# Patient Record
Sex: Female | Born: 1975
Health system: Southern US, Community
[De-identification: ages and names within clinical notes are randomized; demographics above are authoritative.]

## PROBLEM LIST (undated history)

## (undated) DIAGNOSIS — F845 Asperger's syndrome: Secondary | ICD-10-CM

## (undated) DIAGNOSIS — R112 Nausea with vomiting, unspecified: Secondary | ICD-10-CM

## (undated) DIAGNOSIS — F419 Anxiety disorder, unspecified: Secondary | ICD-10-CM

## (undated) DIAGNOSIS — Z803 Family history of malignant neoplasm of breast: Secondary | ICD-10-CM

## (undated) DIAGNOSIS — E785 Hyperlipidemia, unspecified: Secondary | ICD-10-CM

## (undated) DIAGNOSIS — Z808 Family history of malignant neoplasm of other organs or systems: Secondary | ICD-10-CM

## (undated) DIAGNOSIS — I10 Essential (primary) hypertension: Secondary | ICD-10-CM

## (undated) DIAGNOSIS — F32A Depression, unspecified: Secondary | ICD-10-CM

## (undated) DIAGNOSIS — Z9889 Other specified postprocedural states: Secondary | ICD-10-CM

## (undated) DIAGNOSIS — Z8042 Family history of malignant neoplasm of prostate: Secondary | ICD-10-CM

## (undated) DIAGNOSIS — K589 Irritable bowel syndrome without diarrhea: Secondary | ICD-10-CM

## (undated) DIAGNOSIS — F329 Major depressive disorder, single episode, unspecified: Secondary | ICD-10-CM

## (undated) DIAGNOSIS — G43909 Migraine, unspecified, not intractable, without status migrainosus: Secondary | ICD-10-CM

## (undated) HISTORY — PX: LAPAROTOMY: SHX154

## (undated) HISTORY — DX: Anxiety disorder, unspecified: F41.9

## (undated) HISTORY — DX: Family history of malignant neoplasm of breast: Z80.3

## (undated) HISTORY — DX: Irritable bowel syndrome, unspecified: K58.9

## (undated) HISTORY — DX: Hyperlipidemia, unspecified: E78.5

## (undated) HISTORY — DX: Family history of malignant neoplasm of other organs or systems: Z80.8

## (undated) HISTORY — PX: LAPAROSCOPY: SHX197

## (undated) HISTORY — PX: HERNIA REPAIR: SHX51

## (undated) HISTORY — DX: Asperger's syndrome: F84.5

## (undated) HISTORY — PX: ABDOMINAL HYSTERECTOMY: SHX81

## (undated) HISTORY — DX: Essential (primary) hypertension: I10

## (undated) HISTORY — DX: Migraine, unspecified, not intractable, without status migrainosus: G43.909

## (undated) HISTORY — DX: Family history of malignant neoplasm of prostate: Z80.42

---

## 2000-04-19 ENCOUNTER — Other Ambulatory Visit: Admission: RE | Admit: 2000-04-19 | Discharge: 2000-04-19 | Payer: Self-pay | Admitting: *Deleted

## 2004-02-27 ENCOUNTER — Emergency Department (HOSPITAL_COMMUNITY): Admission: EM | Admit: 2004-02-27 | Discharge: 2004-02-27 | Payer: Self-pay | Admitting: Emergency Medicine

## 2007-02-09 ENCOUNTER — Encounter: Admission: RE | Admit: 2007-02-09 | Discharge: 2007-02-09 | Payer: Self-pay | Admitting: *Deleted

## 2007-03-24 ENCOUNTER — Other Ambulatory Visit: Admission: RE | Admit: 2007-03-24 | Discharge: 2007-03-24 | Payer: Self-pay | Admitting: Internal Medicine

## 2008-01-30 ENCOUNTER — Emergency Department (HOSPITAL_COMMUNITY): Admission: EM | Admit: 2008-01-30 | Discharge: 2008-01-30 | Payer: Self-pay | Admitting: Emergency Medicine

## 2008-02-23 ENCOUNTER — Encounter: Admission: RE | Admit: 2008-02-23 | Discharge: 2008-02-23 | Payer: Self-pay | Admitting: Family Medicine

## 2009-07-30 ENCOUNTER — Encounter: Admission: RE | Admit: 2009-07-30 | Discharge: 2009-07-30 | Payer: Self-pay | Admitting: Family Medicine

## 2009-09-05 ENCOUNTER — Ambulatory Visit (HOSPITAL_COMMUNITY): Admission: RE | Admit: 2009-09-05 | Discharge: 2009-09-05 | Payer: Self-pay | Admitting: Obstetrics and Gynecology

## 2009-09-10 ENCOUNTER — Encounter: Admission: RE | Admit: 2009-09-10 | Discharge: 2009-09-10 | Payer: Self-pay | Admitting: Obstetrics and Gynecology

## 2009-12-11 ENCOUNTER — Encounter: Admission: RE | Admit: 2009-12-11 | Discharge: 2009-12-11 | Payer: Self-pay | Admitting: Obstetrics and Gynecology

## 2009-12-24 ENCOUNTER — Ambulatory Visit (HOSPITAL_COMMUNITY): Admission: RE | Admit: 2009-12-24 | Discharge: 2009-12-24 | Payer: Self-pay | Admitting: Obstetrics and Gynecology

## 2010-08-21 ENCOUNTER — Encounter: Payer: Self-pay | Admitting: Pulmonary Disease

## 2010-10-09 DIAGNOSIS — F329 Major depressive disorder, single episode, unspecified: Secondary | ICD-10-CM

## 2010-10-09 DIAGNOSIS — E785 Hyperlipidemia, unspecified: Secondary | ICD-10-CM

## 2010-10-10 ENCOUNTER — Ambulatory Visit: Payer: Self-pay | Admitting: Pulmonary Disease

## 2010-10-10 ENCOUNTER — Encounter: Payer: Self-pay | Admitting: Pulmonary Disease

## 2010-10-10 DIAGNOSIS — R0609 Other forms of dyspnea: Secondary | ICD-10-CM | POA: Insufficient documentation

## 2010-10-10 DIAGNOSIS — R0989 Other specified symptoms and signs involving the circulatory and respiratory systems: Secondary | ICD-10-CM

## 2010-10-10 DIAGNOSIS — I10 Essential (primary) hypertension: Secondary | ICD-10-CM

## 2010-10-10 DIAGNOSIS — J329 Chronic sinusitis, unspecified: Secondary | ICD-10-CM | POA: Insufficient documentation

## 2010-10-21 ENCOUNTER — Ambulatory Visit (HOSPITAL_COMMUNITY)
Admission: RE | Admit: 2010-10-21 | Discharge: 2010-10-21 | Payer: Self-pay | Source: Home / Self Care | Admitting: Pulmonary Disease

## 2010-10-22 ENCOUNTER — Encounter: Payer: Self-pay | Admitting: Pulmonary Disease

## 2010-10-22 DIAGNOSIS — R05 Cough: Secondary | ICD-10-CM

## 2010-10-23 ENCOUNTER — Encounter: Payer: Self-pay | Admitting: Pulmonary Disease

## 2010-11-06 ENCOUNTER — Ambulatory Visit: Payer: Self-pay | Admitting: Internal Medicine

## 2010-12-15 ENCOUNTER — Other Ambulatory Visit: Payer: Self-pay | Admitting: Family Medicine

## 2010-12-15 ENCOUNTER — Ambulatory Visit
Admission: RE | Admit: 2010-12-15 | Discharge: 2010-12-15 | Payer: Self-pay | Source: Home / Self Care | Attending: Pulmonary Disease | Admitting: Pulmonary Disease

## 2010-12-15 DIAGNOSIS — Z1239 Encounter for other screening for malignant neoplasm of breast: Secondary | ICD-10-CM

## 2010-12-15 DIAGNOSIS — J310 Chronic rhinitis: Secondary | ICD-10-CM | POA: Insufficient documentation

## 2010-12-15 DIAGNOSIS — Z1231 Encounter for screening mammogram for malignant neoplasm of breast: Secondary | ICD-10-CM

## 2010-12-23 NOTE — Miscellaneous (Signed)
Summary: med list updated  Clinical Lists Changes  Medications: Added new medication of TOPAMAX 25 MG TABS (TOPIRAMATE) once daily

## 2010-12-23 NOTE — Miscellaneous (Signed)
Summary: Orders Update   Clinical Lists Changes  Orders: Added new Referral order of Radiology Referral (Radiology) - Signed 

## 2010-12-23 NOTE — Assessment & Plan Note (Signed)
Summary: consult for recurrent respiratory infections.   Visit Type:  Initial Consult Copy to:  Herb Grays MD Primary Provider/Referring Provider:  Dewain Penning MD  CC:  Pulmonary Consult. pt c/o cough, recurring lung infections, and trouble breahting.  History of Present Illness: the pt is a 34y/o female who I have been asked to see for dyspnea and recurrent respiratory infections.  She has been having recurrent sinus and chest infections this year, but has not been diagnosed with any chronic lung process as of yet.  Her first issue occurred in Mikka of the year when she had ?pna (xrays not available), and was treated with augmentin for 10 days.  She then had symptoms c/w a sinus infection in May, and was treated with depomedrol and levaquin.  In Sept of this year she became sick again, and cxr report mentions a left perihilar opacity, for which she was treated with levaquin again.  In Nov she had worsening sinus symptoms, and was treated with rocephin, ceftin, pred taper.  The pt tells me each time she gets sick, it starts in her upper airway with sinus pressure and congestion, along with purulence from her nares.  It then goes to her chest and results in dry cough and sob that interferes with her QOL.  She often does not bring up purulent mucus from chest.  She has seen ENT in the past with negative w/u per her history.  She has seen an allergist with +w/u, but couldn't afford allergy vaccine.  Currently, the pt is doing better, with no significant cough or sinus symptoms.  She has no prior h/o asthma, and denies reflux being an issue.    Preventive Screening-Counseling & Management  Alcohol-Tobacco     Smoking Status: quit  Current Medications (verified): 1)  Prednisone 50 Mg Tabs (Prednisone) .... Once Daily X 5 Days 2)  Keflex (? Strenghth) .Marland Kitchen.. 1 Tablet Two Times A Day X 10 Days 3)  Rhinocort Aqua 32 Mcg/act Susp (Budesonide) .... 2 Sprays Per Nostrile Evry Am 4)  Jolessa 0.15-0.03 Mg  Tabs (Levonorgest-Eth Estrad 91-Day) .... Once Daily 5)  Celexa 20 Mg Tabs (Citalopram Hydrobromide) .... Once Daily 6)  Claritin 10 Mg Tabs (Loratadine) .... Once Daily 7)  Niacin (? Stregnth) .... Once Daily 8)  Red Yeast Rice 600 Mg Caps (Red Yeast Rice Extract) .... 2 Once Daily 9)  Tramadol (? Strength) .... As Needed 10)  Proventil Hfa 108 (90 Base) Mcg/act Aers (Albuterol Sulfate) .... 2 Puffs Every 4 Hrs As Needed  Allergies (verified): 1)  ! * Latex  Past History:  Past Medical History: Depression Hyperlipidemia Hypertension  Past Surgical History: hernia repair endometrial surgery x 2  Family History: Reviewed history from 10/09/2010 and no changes required. Family History Asthma: grandpa, brother Family History Breast Cancer:mother, grandmother Family History Hyperlipidemia Family History Prostate Cancer: father ephysema--grandpa  Social History: Reviewed history and no changes required. Patient states former smoker. 2003. occasional smoker occupation: pet sitting owner married Smoking Status:  quit  Review of Systems       The patient complains of shortness of breath with activity, productive cough, non-productive cough, chest pain, loss of appetite, tooth/dental problems, headaches, nasal congestion/difficulty breathing through nose, ear ache, and hand/feet swelling.  The patient denies shortness of breath at rest, coughing up blood, irregular heartbeats, acid heartburn, indigestion, weight change, abdominal pain, difficulty swallowing, sore throat, sneezing, itching, anxiety, depression, rash, change in color of mucus, and fever.    Vital Signs:  Patient profile:   35 year old female Height:      62 inches Weight:      200.13 pounds BMI:     36.74 O2 Sat:      98 % on Room air Temp:     98.1 degrees F oral Pulse rate:   78 / minute BP sitting:   112 / 58  (left arm) Cuff size:   large  Vitals Entered By: Carver Fila (October 10, 2010 2:56 PM)  O2  Flow:  Room air CC: Pulmonary Consult. pt c/o cough, recurring lung infections, trouble breahting Comments meds and allergies updated Phone number updated Carver Fila  October 10, 2010 3:09 PM    Physical Exam  General:  obese female in nad Eyes:  PERRLA and EOMI.   Nose:  patent without discharge.  Deviated septum to right with narrowing. Mouth:  clear, no exudates or lesions. Neck:  no jvd, tmg, LN Lungs:  totally clear to auscultation Heart:  rrr, no mrg Abdomen:  soft and nontender, bs+ Extremities:  no edema or cyanosis  pulses intact distally Neurologic:  alert and oriented, moves all 4.   Impression & Recommendations:  Problem # 1:  DYSPNEA ON EXERTION (ICD-786.09) the pt has recurrent respiratory tract infections?/inflammation with dyspnea of unknown etiology.  Her history is very suspicious for chronic sinusutis, and she needs to have a ct of her sinuses for evaluation.  Other possibilities include bronchiectasis and asthma, although her simple spirometry today is normal.  It will be important for me to review her xrays, and may need HRCT chest to evaluate for bronchiectasis.  I would also consider other etiologies such as allergic disease, hypogammaglobulinemia, and asthma.  At this point, would like to start with ct sinuses since this seems to be the common denominator in all of her episodes, and she will have her xrays put on a disk for my review.  Will arrange followup thereafter.    Medications Added to Medication List This Visit: 1)  Prednisone 50 Mg Tabs (Prednisone) .... Once daily x 5 days 2)  Keflex (? Strenghth)  .Marland Kitchen.. 1 tablet two times a day x 10 days 3)  Rhinocort Aqua 32 Mcg/act Susp (Budesonide) .... 2 sprays per nostrile evry am 4)  Jolessa 0.15-0.03 Mg Tabs (Levonorgest-eth estrad 91-day) .... Once daily 5)  Celexa 20 Mg Tabs (Citalopram hydrobromide) .... Once daily 6)  Claritin 10 Mg Tabs (Loratadine) .... Once daily 7)  Niacin (? Stregnth)  .... Once  daily 8)  Red Yeast Rice 600 Mg Caps (Red yeast rice extract) .... 2 once daily 9)  Tramadol (? Strength)  .... As needed 10)  Proventil Hfa 108 (90 Base) Mcg/act Aers (Albuterol sulfate) .... 2 puffs every 4 hrs as needed  Other Orders: Consultation Level IV (16109) Spirometry w/Graph (60454) Radiology Referral (Radiology)  Patient Instructions: 1)  please pick up disc of cxr's, and drop off out front for me to review. 2)  will check scan of sinuses, and I will call you with results.

## 2010-12-25 NOTE — Assessment & Plan Note (Signed)
Summary: acute sick visit for rhinitis    Copy to:  Herb Grays MD Primary Provider/Referring Provider:  Dewain Penning MD  CC:  Sick visit.  symptoms started 3 days.  pt c/o nasal drainage with clear to yellow to green color and streaks of bright red blood in nasal drainage. non-productive cough and body aches and increased sob. Marland Kitchen  History of Present Illness: the pt comes in today for an acute sick visit.  She has a h/o recurrent pulmonary "infections", and at the last visit this was felt possibly due to chronic sinusitis.  She underwent a ct sinuses, and showed no acute or chronic sinusitis.  She had a ct chest with no evidence of bronchiectasis, and had spirometry which showed no airflow obstruction even with active symptoms.  She does have a history of a positive allergy evaluation, and apparently vaccine was recommended but didn't have time to consider.  She works as a Nature conservation officer time.  She comes in today with a 3 day h/o nasal drainage that is clear to yellow with some blood staining.  She denies sinus pressure, fever, and no significant sob.  She has a nonproductive cough with no chest congestion.  She has not been around sources for viral illnesses.  Current Medications (verified): 1)  Rhinocort Aqua 32 Mcg/act Susp (Budesonide) .... 2 Sprays Per Nostrile Evry Am As Needed 2)  Jolessa 0.15-0.03 Mg Tabs (Levonorgest-Eth Estrad 91-Day) .... Once Daily 3)  Celexa 20 Mg Tabs (Citalopram Hydrobromide) .... Once Daily 4)  Claritin 10 Mg Tabs (Loratadine) .... Once Daily 5)  Niacin (? Stregnth) .... Once Daily 6)  Red Yeast Rice 600 Mg Caps (Red Yeast Rice Extract) .... 2 Once Daily 7)  Tramadol (? Strength) .... As Needed 8)  Proventil Hfa 108 (90 Base) Mcg/act Aers (Albuterol Sulfate) .... 2 Puffs Every 4 Hrs As Needed 9)  Topamax 25 Mg Tabs (Topiramate) .... Once Daily  Allergies (verified): 1)  ! * Latex  Past History:  Past medical, surgical, family and social histories  (including risk factors) reviewed, and no changes noted (except as noted below).  Past Medical History: Reviewed history from 10/10/2010 and no changes required. Depression Hyperlipidemia Hypertension  Past Surgical History: Reviewed history from 10/10/2010 and no changes required. hernia repair endometrial surgery x 2  Family History: Reviewed history from 10/10/2010 and no changes required. Family History Asthma: grandpa, brother Family History Breast Cancer:mother, grandmother Family History Hyperlipidemia Family History Prostate Cancer: father ephysema--grandpa  Social History: Reviewed history from 10/10/2010 and no changes required. Patient states former smoker. 2003. occasional smoker occupation: pet sitting owner married  Review of Systems       The patient complains of shortness of breath with activity, non-productive cough, loss of appetite, sore throat, headaches, nasal congestion/difficulty breathing through nose, sneezing, and itching.  The patient denies shortness of breath at rest, productive cough, coughing up blood, chest pain, irregular heartbeats, acid heartburn, indigestion, weight change, abdominal pain, difficulty swallowing, tooth/dental problems, ear ache, anxiety, depression, hand/feet swelling, joint stiffness or pain, rash, change in color of mucus, and fever.    Vital Signs:  Patient profile:   35 year old female Height:      62 inches Weight:      206.25 pounds BMI:     37.86 O2 Sat:      97 % on Room air Temp:     98.0 degrees F oral Pulse rate:   72 / minute BP sitting:   118 /  82  (left arm) Cuff size:   large  Vitals Entered By: Arman Filter LPN (December 15, 2010 4:16 PM)  O2 Flow:  Room air CC: Sick visit.  symptoms started 3 days.  pt c/o nasal drainage with clear to yellow to green color and streaks of bright red blood in nasal drainage. non-productive cough and body aches and increased sob.  Comments Medications reviewed with  patient Arman Filter LPN  December 15, 2010 4:17 PM    Physical Exam  General:  obese female in nad Nose:  mild erythema bilat with heme staining, no purulent secretions noted.   Mouth:  no exudates, +postnasal drip Lungs:  totally clear to auscultation no wheezing Heart:  rrr, no mrg Extremities:  no edema or cyanosis Neurologic:  alert, oriented, moves all 4.   Impression & Recommendations:  Problem # 1:  CHRONIC RHINITIS (ICD-472.0)  the pt is having symptoms again of nasal drainage that is clear to yellow, sore throat, and cough.  She has a normal ct sinuses, normal ct chest, and normal spirometry.  I suspect her recurrent symptoms are more due to chronic rhinitis than actual infection, and she has known significant allergic disease from prior testing in the past.  I would like to treat her conservatively with daily nasal ICS, changing her antihistamine to zyrtec, and to use neilmed sinus rinses regularly.  If she continues to be symptomatic, I would recommend a f/u allergy evaluation.  Medications Added to Medication List This Visit: 1)  Rhinocort Aqua 32 Mcg/act Susp (Budesonide) .... 2 sprays per nostrile evry am as needed  Other Orders: Est. Patient Level IV (84132)  Patient Instructions: 1)  continue with rhinocort 2 each nostril each am every day 2)  try zyrtec at bedtime in the place of claritin as a trial 3)  neilmed sinus rinses am and pm until better, then each am thereafter if doing well. 4)  please call if having persistent symptoms, and we can discuss allergy referral.

## 2010-12-31 ENCOUNTER — Ambulatory Visit: Payer: Self-pay

## 2011-01-06 ENCOUNTER — Encounter: Payer: Self-pay | Admitting: Family Medicine

## 2011-01-19 ENCOUNTER — Ambulatory Visit
Admission: RE | Admit: 2011-01-19 | Discharge: 2011-01-19 | Disposition: A | Discharge status: Home / Self Care | Payer: 59 | Source: Ambulatory Visit | Attending: Family Medicine | Admitting: Family Medicine

## 2011-01-19 DIAGNOSIS — Z1231 Encounter for screening mammogram for malignant neoplasm of breast: Secondary | ICD-10-CM

## 2011-02-08 LAB — CBC
MCHC: 33.7 g/dL (ref 30.0–36.0)
MCV: 88.2 fL (ref 78.0–100.0)
Platelets: 367 10*3/uL (ref 150–400)
WBC: 6.8 10*3/uL (ref 4.0–10.5)

## 2011-02-08 LAB — HCG, SERUM, QUALITATIVE: Preg, Serum: NEGATIVE

## 2011-08-17 LAB — D-DIMER, QUANTITATIVE: D-Dimer, Quant: 1.14 — ABNORMAL HIGH

## 2012-01-14 ENCOUNTER — Emergency Department (HOSPITAL_COMMUNITY)
Admission: EM | Admit: 2012-01-14 | Discharge: 2012-01-14 | Disposition: A | Discharge status: Home / Self Care | Payer: 59 | Attending: Emergency Medicine | Admitting: Emergency Medicine

## 2012-01-14 ENCOUNTER — Encounter (HOSPITAL_COMMUNITY): Payer: Self-pay | Admitting: Emergency Medicine

## 2012-01-14 DIAGNOSIS — Z9109 Other allergy status, other than to drugs and biological substances: Secondary | ICD-10-CM | POA: Insufficient documentation

## 2012-01-14 DIAGNOSIS — L299 Pruritus, unspecified: Secondary | ICD-10-CM | POA: Insufficient documentation

## 2012-01-14 DIAGNOSIS — T7840XA Allergy, unspecified, initial encounter: Secondary | ICD-10-CM

## 2012-01-14 DIAGNOSIS — Z79899 Other long term (current) drug therapy: Secondary | ICD-10-CM | POA: Insufficient documentation

## 2012-01-14 MED ORDER — DIPHENHYDRAMINE HCL 50 MG/ML IJ SOLN
50.0000 mg | Freq: Once | INTRAMUSCULAR | Status: AC
  Administered 2012-01-14: 50 mg via INTRAVENOUS
  Filled 2012-01-14: qty 1

## 2012-01-14 MED ORDER — METHYLPREDNISOLONE SODIUM SUCC 125 MG IJ SOLR
125.0000 mg | Freq: Once | INTRAMUSCULAR | Status: AC
  Administered 2012-01-14: 125 mg via INTRAVENOUS
  Filled 2012-01-14: qty 2

## 2012-01-14 MED ORDER — FAMOTIDINE 20 MG PO TABS: 20.0000 mg | ORAL_TABLET | Freq: Two times a day (BID) | ORAL | Status: DC

## 2012-01-14 MED ORDER — EPINEPHRINE 0.3 MG/0.3ML IJ DEVI: 0.3000 mg | INTRAMUSCULAR | Status: DC | PRN

## 2012-01-14 MED ORDER — PREDNISONE 10 MG PO TABS: ORAL_TABLET | ORAL | Status: DC

## 2012-01-14 MED ORDER — FAMOTIDINE IN NACL 20-0.9 MG/50ML-% IV SOLN
20.0000 mg | Freq: Once | INTRAVENOUS | Status: AC
  Administered 2012-01-14: 20 mg via INTRAVENOUS
  Filled 2012-01-14: qty 50

## 2012-01-14 NOTE — ED Provider Notes (Signed)
History     CSN: [id number]  Arrival date & time 01/14/12  1801   First MD Initiated Contact with Patient 01/14/12 1825      Chief Complaint  Patient presents with  . Allergic Reaction    (Consider location/radiation/quality/duration/timing/severity/associated sxs/prior treatment) HPI  Patient relates she has a lot of environmental allergies. She states today she was at [facility] to have allergy testing done. She states she stopped all her allergy medicine which was Allegra about 7 days ago. She states they put the allergens in her back and almost immediately she started itching and felt like her breathing was heavy. She states she felt like her thoughts were not clear and they commented that she appeared to be very flushed. She was given Benadryl 50 mg and EpiPen 0.3 mg and sent to the ER patient states now she just feels heavy all over. She denies chest pain, headache, nausea, vomiting, diarrhea and  PCP Dr Collins [place]  History reviewed. No pertinent past medical history. Environmental allergies  History reviewed. No pertinent past surgical history.  History reviewed. No pertinent family history.  History  Substance Use Topics  . Smoking status: No  . Smokeless tobacco: Not on file  . Alcohol Use: No  Employed  OB History    Grav Para Term Preterm Abortions TAB SAB Ect Mult Living                  Review of Systems  All other systems reviewed and are negative.    Allergies  Latex  Home Medications   Current Outpatient Rx  Name Route Sig Dispense Refill  . BUPROPION HCL ER (XL) 150 MG PO TB24 Oral Take 150 mg by mouth daily.    Marland Kitchen CITALOPRAM HYDROBROMIDE 20 MG PO TABS Oral Take 20 mg by mouth daily.    Joyce Copa PO Oral Take 1 tablet by mouth daily.    Marland Kitchen NIACIN CR PO Oral Take 1 tablet by mouth daily.    Marland Kitchen NITROFURANTOIN MONOHYD MACRO 100 MG PO CAPS Oral Take 100 mg by mouth 2 (two) times daily. Course started 01/12/12 for 5 day supply    . TOPIRAMATE 25 MG PO  TABS Oral Take 25 mg by mouth daily.    Marland Kitchen EPINEPHRINE 0.3 MG/0.3ML IJ DEVI Intramuscular Inject 0.3 mLs (0.3 mg total) into the muscle as needed. 1 Device 0  . FAMOTIDINE 20 MG PO TABS Oral Take 1 tablet (20 mg total) by mouth 2 (two) times daily. 30 tablet 0  . PREDNISONE 10 MG PO TABS  Take 3 po QD x 2d starting tomorrow, then 2 po QD x 3d then 1 po QD x 3d 15 tablet 0    BP 130/77  Pulse 87  Temp(Src) 98.7 F (37.1 C) (Oral)  Resp 26  SpO2 99%  Vital signs normal     Physical Exam  Constitutional: She appears well-developed and well-nourished. She is cooperative.  HENT:  Head: Normocephalic and atraumatic.  Eyes: Conjunctivae and EOM are normal. Pupils are equal, round, and reactive to light.  Neck: Normal range of motion and full passive range of motion without pain. Neck supple.  Cardiovascular: Normal rate, regular rhythm and normal heart sounds.   Pulmonary/Chest: Effort normal and breath sounds normal. Not tachypneic. No respiratory distress.  Neurological: She has normal strength and normal reflexes. No cranial nerve deficit.  Skin: Skin is warm, dry and intact.       Patient has mild diffuse redness of her  back. She has also some mild redness of her upper chest neck and her face. There are no discrete urticarial lesions seen. There are no localized areas of swelling on her back where she had her skin testing done.  Psychiatric: She has a normal mood and affect. Her speech is normal and behavior is normal. Thought content normal.    ED Course  Procedures (including critical care time)   Medications  diphenhydrAMINE (BENADRYL) injection 50 mg (50 mg Intravenous Given 01/14/12 1917)  methylPREDNISolone sodium succinate (SOLU-MEDROL) 125 MG injection 125 mg (125 mg Intravenous Given 01/14/12 1917)  famotidine (PEPCID) IVPB 20 mg (20 mg Intravenous Given 01/14/12 1918)   When rechecked patient still had some mild redness of her face however the redness on her back and upper  chest were gone. She states she felt good enough to go home.  1. Allergic reaction     New Prescriptions   EPINEPHRINE (EPIPEN) 0.3 MG/0.3 ML DEVI    Inject 0.3 mLs (0.3 mg total) into the muscle as needed.   FAMOTIDINE (PEPCID) 20 MG TABLET    Take 1 tablet (20 mg total) by mouth 2 (two) times daily.   PREDNISONE (DELTASONE) 10 MG TABLET    Take 3 po QD x 2d starting tomorrow, then 2 po QD x 3d then 1 po QD x 3d   Plan discharge Devoria Albe, MD, Armando Gang   MDM          Ward Givens, MD 01/14/12 2114

## 2012-01-14 NOTE — ED Notes (Signed)
MD at bedside. 

## 2012-01-14 NOTE — ED Notes (Signed)
Patient is resting comfortably. 

## 2012-01-14 NOTE — ED Notes (Signed)
Pt assisted to restroom and back to bed. Pt had no issues with ambulation.

## 2012-01-14 NOTE — Discharge Instructions (Signed)
Take the Pepcid and prednisone until gone. Take Benadryl 50 mg every 4-6 hours as needed for itching or rash. Avoid getting hot this will make your allergic reaction worse. Use the epipen if you have trouble breathing or swallowing. Return to emergency department if you feel worse.

## 2012-01-14 NOTE — ED Notes (Signed)
Patient denies pain and is resting comfortably.  

## 2012-01-14 NOTE — ED Notes (Signed)
-   PT C/0 ALLERGIC REACTION WHILE AT ALLERGY TESTING.       REC'D BENEDRYL 50MG  IM AND EPI 0.3MG  IM.    NOW DENIES LOC OR SOB.

## 2012-01-14 NOTE — ED Notes (Signed)
Vital signs stable. 

## 2012-01-14 NOTE — ED Notes (Signed)
ZOX:[id number]<AV> Expected date:01/14/12<BR> Expected time: 5:47 PM<BR> Means of arrival:Ambulance<BR> Comments:<BR> EMS 80 GC - allergic reaction

## 2012-01-14 NOTE — ED Notes (Signed)
Pt.was gowned ,put on monitor ,vital were taken

## 2012-01-14 NOTE — ED Notes (Signed)
Family at bedside. 

## 2012-01-15 ENCOUNTER — Emergency Department (HOSPITAL_COMMUNITY)
Admission: EM | Admit: 2012-01-15 | Discharge: 2012-01-15 | Disposition: A | Discharge status: Home / Self Care | Payer: 59 | Attending: Emergency Medicine | Admitting: Emergency Medicine

## 2012-01-15 ENCOUNTER — Encounter (HOSPITAL_COMMUNITY): Payer: Self-pay | Admitting: *Deleted

## 2012-01-15 DIAGNOSIS — F329 Major depressive disorder, single episode, unspecified: Secondary | ICD-10-CM | POA: Insufficient documentation

## 2012-01-15 DIAGNOSIS — Z79899 Other long term (current) drug therapy: Secondary | ICD-10-CM | POA: Insufficient documentation

## 2012-01-15 DIAGNOSIS — F3289 Other specified depressive episodes: Secondary | ICD-10-CM | POA: Insufficient documentation

## 2012-01-15 DIAGNOSIS — R21 Rash and other nonspecific skin eruption: Secondary | ICD-10-CM | POA: Insufficient documentation

## 2012-01-15 DIAGNOSIS — R209 Unspecified disturbances of skin sensation: Secondary | ICD-10-CM | POA: Insufficient documentation

## 2012-01-15 DIAGNOSIS — T7840XA Allergy, unspecified, initial encounter: Secondary | ICD-10-CM

## 2012-01-15 DIAGNOSIS — L509 Urticaria, unspecified: Secondary | ICD-10-CM | POA: Insufficient documentation

## 2012-01-15 DIAGNOSIS — I10 Essential (primary) hypertension: Secondary | ICD-10-CM | POA: Insufficient documentation

## 2012-01-15 HISTORY — DX: Depression, unspecified: F32.A

## 2012-01-15 HISTORY — DX: Major depressive disorder, single episode, unspecified: F32.9

## 2012-01-15 MED ORDER — DIPHENHYDRAMINE HCL 50 MG/ML IJ SOLN: 25.0000 mg | Freq: Once | INTRAMUSCULAR | Status: DC

## 2012-01-15 MED ORDER — DIPHENHYDRAMINE HCL 50 MG/ML IJ SOLN
50.0000 mg | Freq: Once | INTRAMUSCULAR | Status: AC
  Administered 2012-01-15: 50 mg via INTRAVENOUS
  Filled 2012-01-15: qty 1

## 2012-01-15 NOTE — Discharge Instructions (Signed)
Take a dose of Bendaryl 25mg  tonight, a dose tomorrow morning (25mg ) and another dose tomorrow night (25mg ), on Sunday take 12.5 (half tab) Morning, lunch and night time. Continue all other medications as prescribed yesterday.  Allergic Reaction, Mild to Moderate Allergies may happen from anything your body is sensitive to. This may be food, medications, pollens, chemicals, and nearly anything around you in everyday life that produces allergens. An allergen is anything that causes an allergy producing substance. Allergens cause your body to release allergic antibodies. Through a chain of events, they cause a release of histamine into the blood stream. Histamines are meant to protect you, but they also cause your discomfort. This is why antihistamines are often used for allergies. Heredity is often a factor in causing allergic reactions. This means you may have some of the same allergies as your parents. Allergies happen in all age groups. You may have some idea of what caused your reaction. There are many allergens around [place]. It may be difficult to know what caused your reaction. If this is a first time event, it may never happen again. Allergies cannot be cured but can be controlled with medications. SYMPTOMS  You may get some or all of the following problems from allergies.  Swelling and itching in and around the mouth.   Tearing, itchy eyes.   Nasal congestion and runny nose.   Sneezing and coughing.   An itchy red rash or hives.   Vomiting or diarrhea.   Difficulty breathing.  Seasonal allergies occur in all age groups. They are seasonal because they usually occur during the same season every year. They may be a reaction to molds, grass pollens, or tree pollens. Other causes of allergies are house dust mite allergens, pet dander and mold spores. These are just a common few of the thousands of allergens around [place]. All of the symptoms listed above happen when you come in contact with pollens  and other allergens. Seasonal allergies are usually not life threatening. They are generally more of a nuisance that can often be handled using medications. Hay fever is a combination of all or some of the above listed allergy problems. It may often be treated with simple over-the-counter medications such as diphenhydramine. Take medication as directed. Check with your caregiver or package insert for child dosages. TREATMENT AND HOME CARE INSTRUCTIONS If hives or rash are present:  Take medications as directed.   You may use an over-the-counter antihistamine (diphenhydramine) for hives and itching as needed. Do not drive or drink alcohol until medications used to treat the reaction have worn off. Antihistamines tend to make people sleepy.   Apply cold cloths (compresses) to the skin or take baths in cool water. This will help itching. Avoid hot baths or showers. Heat will make a rash and itching worse.   If your allergies persist and become more severe, and over the counter medications are not effective, there are many new medications your caretaker can prescribe. Immunotherapy or desensitizing injections can be used if all else fails. Follow up with your caregiver if problems continue.  SEEK MEDICAL CARE IF:   Your allergies are becoming progressively more troublesome.   You suspect a food allergy. Symptoms generally happen within 30 minutes of eating a food.   Your symptoms have not gone away within 2 days or are getting worse.   You develop new symptoms.   You want to retest yourself or your child with a food or drink you think causes an allergic  reaction. Never test yourself or your child of a suspected allergy without being under the watchful eye of your caregivers. A second exposure to an allergen may be life-threatening.  SEEK IMMEDIATE MEDICAL CARE IF:  You develop difficulty breathing or wheezing, or have a tight feeling in your chest or throat.   You develop a swollen mouth,  hives, swelling, or itching all over your body.  A severe reaction with any of the above problems should be considered life-threatening. If you suddenly develop difficulty breathing call for local emergency medical help. THIS IS AN EMERGENCY. MAKE SURE YOU:   Understand these instructions.   Will watch your condition.   Will get help right away if you are not doing well or get worse.  Document Released: 09/06/2007 Document Revised: 07/22/2011 Document Reviewed: 09/06/2007 [facility] Patient Information 2012 [place], [place].

## 2012-01-15 NOTE — ED Notes (Signed)
Pt in stating she thinks she is having an allergic reaction, states she was seen yesterday for an anaphylactic reaction and sent home, this afternoon noted similar symptoms, c/o facial redness, chest tightness, confusion, and nausea- unsure of allergen

## 2012-01-15 NOTE — ED Provider Notes (Signed)
History     CSN: [id number]  Arrival date & time 01/15/12  1549   First MD Initiated Contact with Patient 01/15/12 1601      Chief Complaint  Patient presents with  . Allergic Reaction    (Consider location/radiation/quality/duration/timing/severity/associated sxs/prior treatment) HPI  Pt presents to the ED with complaints of allergic reaction. Pt was seen yesterday after having Anaphylactic reaction after receiving allergy testing shots. She was sent home with EpiPens, famotidine, and a prednisone taper. Today around 2:30 the patient states her face started to tingle and feel warm as well as her chest. She states that this is how her symptoms started last time. She denied having difficulty breathing, she does not have tongue, lip or throat swelling. Pt is speaking in complete sentences, her breathing is not labored, and she is handling her oral secretions well.  Past Medical History  Diagnosis Date  . Endometriosis   . Depression     History reviewed. No pertinent past surgical history.  History reviewed. No pertinent family history.  History  Substance Use Topics  . Smoking status: Not on file  . Smokeless tobacco: Not on file  . Alcohol Use:     OB History    Grav Para Term Preterm Abortions TAB SAB Ect Mult Living                  Review of Systems  All other systems reviewed and are negative.    Allergies  Latex  Home Medications   Current Outpatient Rx  Name Route Sig Dispense Refill  . BUPROPION HCL ER (XL) 150 MG PO TB24 Oral Take 150 mg by mouth daily.    Marland Kitchen CITALOPRAM HYDROBROMIDE 20 MG PO TABS Oral Take 20 mg by mouth daily.    Marland Kitchen EPINEPHRINE 0.3 MG/0.3ML IJ DEVI Intramuscular Inject 0.3 mLs (0.3 mg total) into the muscle as needed. 1 Device 0  . FAMOTIDINE 20 MG PO TABS Oral Take 1 tablet (20 mg total) by mouth 2 (two) times daily. 30 tablet 0  . ALLEGRA PO Oral Take 1 tablet by mouth daily.    Marland Kitchen NIACIN CR PO Oral Take 1 tablet by mouth daily.      Marland Kitchen NITROFURANTOIN MONOHYD MACRO 100 MG PO CAPS Oral Take 100 mg by mouth 2 (two) times daily. Course started 01/12/12 for 5 day supply    . PREDNISONE 10 MG PO TABS  Take 3 po QD x 2d starting tomorrow, then 2 po QD x 3d then 1 po QD x 3d 15 tablet 0  . TOPIRAMATE 25 MG PO TABS Oral Take 25 mg by mouth daily.      BP 122/85  Pulse 77  Temp(Src) 98.8 F (37.1 C) (Oral)  Resp 18  SpO2 99%  Physical Exam  Constitutional: She is oriented to person, place, and time. She appears well-developed and well-nourished.  HENT:  Head: Normocephalic and atraumatic.  Mouth/Throat: Oropharynx is clear and moist.  Eyes: Conjunctivae are normal. Pupils are equal, round, and reactive to light.  Neck: Trachea normal, normal range of motion and full passive range of motion without pain. Neck supple.  Cardiovascular: Normal rate, regular rhythm and normal pulses.   Pulmonary/Chest: Effort normal and breath sounds normal. No respiratory distress. She has no wheezes. Chest wall is not dull to percussion. She exhibits no crepitus, no edema, no deformity and no retraction.  Abdominal: Soft. Normal appearance and bowel sounds are normal.  Musculoskeletal: Normal range of motion.  Neurological: She  is alert and oriented to person, place, and time. She has normal strength.  Skin: Skin is warm, dry and intact. Rash noted. Rash is urticarial.     Psychiatric: She has a normal mood and affect. Her speech is normal and behavior is normal. Judgment and thought content normal. Cognition and memory are normal.    ED Course  Procedures (including critical care time)  Labs Reviewed - No data to display No results found.   1. Allergic reaction       MDM  I personally instructed patient on how to use EPI and when to use it. Pt has EpiPens in exam room with her. She is currently taking a prednisone taper, Allegra, Pepcid at home. I have added a Benadryl regimen for her to take over the next 3 days for added  coverage.  After patient received 50mg  IM of Benadryl and I re-evaluated, her face/chest are no longer flushed.        Dorthula Matas, PA 01/15/12 1715

## 2012-01-16 NOTE — ED Provider Notes (Signed)
Medical screening examination/treatment/procedure(s) were performed by non-physician practitioner and as supervising physician I was immediately available for consultation/collaboration.  Lissie Hinesley T Monzerrath Mcburney, MD 01/16/12 2024 

## 2012-05-25 ENCOUNTER — Other Ambulatory Visit: Payer: Self-pay | Admitting: Family Medicine

## 2012-05-25 DIAGNOSIS — Z1231 Encounter for screening mammogram for malignant neoplasm of breast: Secondary | ICD-10-CM

## 2012-06-21 ENCOUNTER — Ambulatory Visit: Payer: 59

## 2012-06-27 ENCOUNTER — Ambulatory Visit: Payer: 59

## 2012-06-30 ENCOUNTER — Ambulatory Visit (INDEPENDENT_AMBULATORY_CARE_PROVIDER_SITE_OTHER): Payer: 59 | Admitting: Internal Medicine

## 2012-06-30 ENCOUNTER — Encounter: Payer: Self-pay | Admitting: Internal Medicine

## 2012-06-30 VITALS — BP 136/91 | HR 75 | Temp 98.6°F | Resp 16 | Ht 62.0 in | Wt 202.0 lb

## 2012-06-30 DIAGNOSIS — Z803 Family history of malignant neoplasm of breast: Secondary | ICD-10-CM

## 2012-06-30 DIAGNOSIS — I10 Essential (primary) hypertension: Secondary | ICD-10-CM

## 2012-06-30 DIAGNOSIS — N809 Endometriosis, unspecified: Secondary | ICD-10-CM

## 2012-06-30 DIAGNOSIS — F329 Major depressive disorder, single episode, unspecified: Secondary | ICD-10-CM

## 2012-06-30 NOTE — Patient Instructions (Addendum)
See me as needed 

## 2012-06-30 NOTE — Progress Notes (Signed)
Subjective:    Patient ID: Lauren Nielsen, female    DOB: 09/12/1976, 36 y.o.   MRN: [id number]  HPI Lauren Nielsen is here as a new pt for second opinion regarding HT.  She wishes to continue her primary care with Dr. Dewain Penning.  PMH of depression, endometriosis, hyperlipidemia, migriane, and endometriosis.  She has a strong Fh of breast cancer in mother and maternal GM.  Mother diagnosed at age 34.  She is to have upcoming hysterectomy with Dr. Henderson Cloud for severe endometriosis that has failed conservative Tx.  She has one ovary and states Dr. Henderson Cloud does not wish to give HT if ovary has to be removed due to strong FH of breast Ca.    She is concerned about surgical menopause and how to handle any symptoms if remaining ovary is removed.  She does not wish BRCA testing despite my advise to have this done  Allergies  Allergen Reactions  . Ppd (Tuberculin Purified Protein Derivative) Anaphylaxis  . Latex    Past Medical History  Diagnosis Date  . Endometriosis   . Depression   . Anxiety   . Hyperlipidemia   . Migraines   . Asperger's disorder    Past Surgical History  Procedure Date  . Laparotomy   . Laparoscopy   . Hernia repair    History   Social History  . Marital Status: Married    Spouse Name: N/A    Number of Children: N/A  . Years of Education: N/A   Occupational History  . Airline pilot    Social History Main Topics  . Smoking status: Former Smoker -- .5 years    Types: Cigarettes  . Smokeless tobacco: Never Used  . Alcohol Use: No  . Drug Use: Yes     marijuana in 20's  . Sexually Active: Yes -- Female, Female partner(s)   Other Topics Concern  . Not on file   Social History Narrative  . No narrative on file   Family History  Problem Relation Age of Onset  . Cancer Mother     breast,uterine,liver,melanoma,  . Cancer Father     prostate and brain  . Depression Brother   . Hypothyroidism Brother    Patient Active Problem List  Diagnosis  .  HYPERLIPIDEMIA  . DEPRESSION  . HYPERTENSION  . UNSPECIFIED SINUSITIS  . DYSPNEA ON EXERTION  . COUGH  . CHRONIC RHINITIS  . Endometriosis  . Family history of breast cancer in first degree relative   Current Outpatient Prescriptions on File Prior to Visit  Medication Sig Dispense Refill  . citalopram (CELEXA) 20 MG tablet Take 20 mg by mouth daily.      . diphenhydrAMINE (SOMINEX) 25 MG tablet Take 25 mg by mouth as needed.      Marland Kitchen EPINEPHrine (EPIPEN) 0.3 mg/0.3 mL DEVI Inject 0.3 mLs (0.3 mg total) into the muscle as needed.  1 Device  0  . Fexofenadine HCl (ALLEGRA PO) Take 1 tablet by mouth daily.      Marland Kitchen topiramate (TOPAMAX) 25 MG tablet Take 25 mg by mouth daily.           Review of Systems See hpi    Objective:   Physical Exam Physical Exam  Nursing note and vitals reviewed.  Constitutional: She is oriented to person, place, and time. She appears well-developed and well-nourished.  HENT:  Head: Normocephalic and atraumatic.  Cardiovascular: Normal rate and regular rhythm. Exam reveals no gallop and no friction rub.  No murmur heard.  Pulmonary/Chest: Breath sounds normal. She has no wheezes. She has no rales.  Neurological: She is alert and oriented to person, place, and time.  Skin: Skin is warm and dry.  Psychiatric: She has a normal mood and affect. Her behavior is normal.              Assessment & Plan:  Severe endometriosis with upcoming hysterectomy and possible oopherectomy  Explained in great detail surgical menopause in the event of complete oopherectomy.  She has strong FHof premenopausal breast cancer in mother and GM.  Pt declines BRCA testing.  Discussed non hormone options for any symptomatology including anti-depressant, Clonidine or Ndeurontin.  I advised that this non HT options would be my first choice  I also gave number to [facility] if she wishes to speak to breast medical oncologist  I spent 45 mintues with this pt,

## 2012-07-12 ENCOUNTER — Encounter (INDEPENDENT_AMBULATORY_CARE_PROVIDER_SITE_OTHER): Payer: Self-pay | Admitting: Surgery

## 2012-07-12 ENCOUNTER — Ambulatory Visit
Admission: RE | Admit: 2012-07-12 | Discharge: 2012-07-12 | Disposition: A | Discharge status: Home / Self Care | Payer: 59 | Source: Ambulatory Visit | Attending: Family Medicine | Admitting: Family Medicine

## 2012-07-12 DIAGNOSIS — Z1231 Encounter for screening mammogram for malignant neoplasm of breast: Secondary | ICD-10-CM

## 2012-07-27 ENCOUNTER — Ambulatory Visit (INDEPENDENT_AMBULATORY_CARE_PROVIDER_SITE_OTHER): Payer: Self-pay | Admitting: Surgery

## 2012-08-01 ENCOUNTER — Other Ambulatory Visit: Payer: Self-pay | Admitting: Obstetrics and Gynecology

## 2012-08-08 ENCOUNTER — Telehealth: Payer: Self-pay | Admitting: *Deleted

## 2012-08-08 NOTE — Telephone Encounter (Signed)
Called pt to scheduled new pt appt

## 2012-08-19 ENCOUNTER — Telehealth: Payer: Self-pay | Admitting: *Deleted

## 2012-08-19 NOTE — Telephone Encounter (Signed)
Pt called concerned she has a UTI advised pt to go to UC as Dr Dewayne Shorter will not be in office until Tuesday. Pt has an appt for Tuesday

## 2012-08-23 ENCOUNTER — Encounter: Payer: Self-pay | Admitting: Internal Medicine

## 2012-08-23 ENCOUNTER — Ambulatory Visit (INDEPENDENT_AMBULATORY_CARE_PROVIDER_SITE_OTHER): Payer: 59 | Admitting: Internal Medicine

## 2012-08-23 VITALS — BP 122/88 | HR 101 | Temp 97.4°F | Resp 20 | Wt 196.0 lb

## 2012-08-23 DIAGNOSIS — F329 Major depressive disorder, single episode, unspecified: Secondary | ICD-10-CM

## 2012-08-23 DIAGNOSIS — N390 Urinary tract infection, site not specified: Secondary | ICD-10-CM

## 2012-08-23 DIAGNOSIS — R030 Elevated blood-pressure reading, without diagnosis of hypertension: Secondary | ICD-10-CM

## 2012-08-23 DIAGNOSIS — Z8669 Personal history of other diseases of the nervous system and sense organs: Secondary | ICD-10-CM

## 2012-08-23 LAB — POCT URINALYSIS DIPSTICK
Protein, UA: NEGATIVE
Spec Grav, UA: 1.02
Urobilinogen, UA: NEGATIVE

## 2012-08-23 NOTE — Progress Notes (Signed)
Subjective:    Patient ID: Lauren Nielsen, female    DOB: 28-Oct-1976, 36 y.o.   MRN: [id number]  HPI Getsemani is here for follow up.  She had hysterectomy by Dr. Henderson Cloud and has one ovary.  Extensive endometriosis  Was seen in urgent care at [facility] and has finished a 3 day course of cipro.  No symptoms now  Under quite a bit of stress,  Is separating from husband and is involved in a same sex relationship now.  Is also studying for LSat  Allergies  Allergen Reactions  . Ppd (Tuberculin Purified Protein Derivative) Anaphylaxis  . Latex    Past Medical History  Diagnosis Date  . Endometriosis   . Depression   . Anxiety   . Hyperlipidemia   . Migraines   . Asperger's disorder    Past Surgical History  Procedure Date  . Laparotomy   . Laparoscopy   . Hernia repair   . Abdominal hysterectomy    History   Social History  . Marital Status: Married    Spouse Name: N/A    Number of Children: N/A  . Years of Education: N/A   Occupational History  . Airline pilot    Social History Main Topics  . Smoking status: Former Smoker -- .5 years    Types: Cigarettes  . Smokeless tobacco: Never Used  . Alcohol Use: No  . Drug Use: Yes     marijuana in 20's  . Sexually Active: Yes -- Female, Female partner(s)    Birth Control/ Protection: Surgical   Other Topics Concern  . Not on file   Social History Narrative  . No narrative on file   Family History  Problem Relation Age of Onset  . Cancer Mother     breast,uterine,liver,melanoma,  . Cancer Father     prostate and brain  . Depression Brother   . Hypothyroidism Brother    Patient Active Problem List  Diagnosis  . HYPERLIPIDEMIA  . DEPRESSION  . HYPERTENSION  . UNSPECIFIED SINUSITIS  . DYSPNEA ON EXERTION  . COUGH  . CHRONIC RHINITIS  . Endometriosis  . Family history of breast cancer in first degree relative   Current Outpatient Prescriptions on File Prior to Visit  Medication Sig Dispense Refill  . citalopram  (CELEXA) 20 MG tablet Take 20 mg by mouth daily.      . diphenhydrAMINE (SOMINEX) 25 MG tablet Take 25 mg by mouth as needed.      Marland Kitchen Fexofenadine HCl (ALLEGRA PO) Take 1 tablet by mouth daily.      Marland Kitchen topiramate (TOPAMAX) 25 MG tablet Take 25 mg by mouth daily.      Marland Kitchen EPINEPHrine (EPIPEN) 0.3 mg/0.3 mL DEVI Inject 0.3 mLs (0.3 mg total) into the muscle as needed.  1 Device  0       Review of Systems See HPi    Objective:   Physical Exam Physical Exam  Nursing note and vitals reviewed.  Constitutional: She is oriented to person, place, and time. She appears well-developed and well-nourished.  HENT:  Head: Normocephalic and atraumatic.  Cardiovascular: Normal rate and regular rhythm. Exam reveals no gallop and no friction rub.  No murmur heard.  Pulmonary/Chest: Breath sounds normal. She has no wheezes. She has no rales.  Neurological: She is alert and oriented to person, place, and time.  Skin: Skin is warm and dry.  Psychiatric: She has a normal mood and affect. Her behavior is normal.  Assessment & Plan:  UTI:  U/A today shows trace blood and LE  willl send for culture.  She is S/P hysterectomy from just 3 weeks ago  Elevated BP  Pressure came down nicely after sitting for a few minutes  Depression  Continue cureent meds  Migraine continue current meds

## 2012-08-23 NOTE — Patient Instructions (Addendum)
See me as needed 

## 2012-08-24 ENCOUNTER — Telehealth: Payer: Self-pay | Admitting: Internal Medicine

## 2012-08-24 ENCOUNTER — Other Ambulatory Visit: Payer: Self-pay | Admitting: Internal Medicine

## 2012-08-24 NOTE — Telephone Encounter (Signed)
Per pt she came in on Tuesday, and she thinks she already needs medication to treat another UTI... She just finish meds on Monday, but it did not take care of the problem per pt... Please call pt at [phone].... Thanks

## 2012-08-25 ENCOUNTER — Telehealth: Payer: Self-pay | Admitting: *Deleted

## 2012-08-25 NOTE — Telephone Encounter (Signed)
Called in new abx rx

## 2012-08-26 LAB — URINE CULTURE: Organism ID, Bacteria: NO GROWTH

## 2012-10-19 ENCOUNTER — Other Ambulatory Visit: Payer: Self-pay | Admitting: Internal Medicine

## 2012-10-19 MED ORDER — CITALOPRAM HYDROBROMIDE 20 MG PO TABS: 20.0000 mg | ORAL_TABLET | Freq: Every day | ORAL | Status: DC

## 2012-10-19 NOTE — Telephone Encounter (Signed)
See Ardenia's note 

## 2012-10-19 NOTE — Telephone Encounter (Signed)
Pt would like to know if she can have her Citalopram Hydrobromide (Tab) CELEXA 20 MG Take 20 mg by mouth daily.  and a YEAST infection medication sent to [facility] [address] IN [place]... She feels like a yeast infection is coming monistat is not working... Please call pt at [phone]

## 2012-10-25 ENCOUNTER — Ambulatory Visit: Payer: 59 | Admitting: Internal Medicine

## 2012-10-25 ENCOUNTER — Ambulatory Visit (INDEPENDENT_AMBULATORY_CARE_PROVIDER_SITE_OTHER): Payer: 59 | Admitting: Internal Medicine

## 2012-10-25 ENCOUNTER — Encounter: Payer: Self-pay | Admitting: Internal Medicine

## 2012-10-25 VITALS — BP 120/78 | HR 73 | Temp 97.3°F | Resp 18 | Wt 187.4 lb

## 2012-10-25 DIAGNOSIS — N9489 Other specified conditions associated with female genital organs and menstrual cycle: Secondary | ICD-10-CM

## 2012-10-25 DIAGNOSIS — N949 Unspecified condition associated with female genital organs and menstrual cycle: Secondary | ICD-10-CM

## 2012-10-25 DIAGNOSIS — B373 Candidiasis of vulva and vagina: Secondary | ICD-10-CM | POA: Insufficient documentation

## 2012-10-25 LAB — POCT URINALYSIS DIPSTICK
Bilirubin, UA: NEGATIVE
Blood, UA: NEGATIVE
Glucose, UA: NEGATIVE
Nitrite, UA: NEGATIVE
Spec Grav, UA: 1.01

## 2012-10-25 MED ORDER — TERCONAZOLE 0.8 % VA CREA: TOPICAL_CREAM | VAGINAL | Status: DC

## 2012-10-25 MED ORDER — FLUCONAZOLE 150 MG PO TABS: 150.0000 mg | ORAL_TABLET | Freq: Once | ORAL | Status: DC

## 2012-10-25 NOTE — Patient Instructions (Addendum)
See me as needed 

## 2012-10-25 NOTE — Progress Notes (Signed)
Subjective:    Patient ID: Lauren Nielsen, female    DOB: 06-Sep-1976, 36 y.o.   MRN: [id number]  HPI  I have a yeast infection.  Itchy vaginal discharge.  Monistate helping a little/    Pt reports red itchy rash on outside of vagina area  Allergies  Allergen Reactions  . Ppd (Tuberculin Purified Protein Derivative) Anaphylaxis  . Latex    Past Medical History  Diagnosis Date  . Endometriosis   . Depression   . Anxiety   . Hyperlipidemia   . Migraines   . Asperger's disorder    Past Surgical History  Procedure Date  . Laparotomy   . Laparoscopy   . Hernia repair   . Abdominal hysterectomy    History   Social History  . Marital Status: Married    Spouse Name: N/A    Number of Children: N/A  . Years of Education: N/A   Occupational History  . Airline pilot    Social History Main Topics  . Smoking status: Former Smoker -- .5 years    Types: Cigarettes  . Smokeless tobacco: Never Used  . Alcohol Use: No  . Drug Use: Yes     Comment: marijuana in 20's  . Sexually Active: Yes -- Female, Female partner(s)    Birth Control/ Protection: Surgical   Other Topics Concern  . Not on file   Social History Narrative  . No narrative on file   Family History  Problem Relation Age of Onset  . Cancer Mother     breast,uterine,liver,melanoma,  . Cancer Father     prostate and brain  . Depression Brother   . Hypothyroidism Brother    Patient Active Problem List  Diagnosis  . HYPERLIPIDEMIA  . DEPRESSION  . HYPERTENSION  . UNSPECIFIED SINUSITIS  . DYSPNEA ON EXERTION  . COUGH  . CHRONIC RHINITIS  . Endometriosis  . Family history of breast cancer in first degree relative  . History of migraine headaches   Current Outpatient Prescriptions on File Prior to Visit  Medication Sig Dispense Refill  . buPROPion (WELLBUTRIN) 100 MG tablet Take 100 mg by mouth once.      . citalopram (CELEXA) 20 MG tablet Take 1 tablet (20 mg total) by mouth daily.  30 tablet  2  .  diphenhydrAMINE (SOMINEX) 25 MG tablet Take 25 mg by mouth as needed.      Marland Kitchen Fexofenadine HCl (ALLEGRA PO) Take 1 tablet by mouth daily.      Marland Kitchen topiramate (TOPAMAX) 25 MG tablet Take 25 mg by mouth daily.      Marland Kitchen EPINEPHrine (EPIPEN) 0.3 mg/0.3 mL DEVI Inject 0.3 mLs (0.3 mg total) into the muscle as needed.  1 Device  0     Review of Systems    see HPI Objective:   Physical Exam Physical Exam  Nursing note and vitals reviewed.  Constitutional: She is oriented to person, place, and time. She appears well-developed and well-nourished.  HENT:  Head: Normocephalic and atraumatic.  Cardiovascular: Normal rate and regular rhythm. Exam reveals no gallop and no friction rub.  No murmur heard.  Pulmonary/Chest: Breath sounds normal. She has no wheezes. She has no rales. Pelvic external exam only:  Red rash labial folds and erythematous rash intertrigo area with satellite lesions  Neurological: She is alert and oriented to person, place, and time.  Skin: Skin is warm and dry.  Psychiatric: She has a normal mood and affect. Her behavior is normal.  Assessment & Plan:  Probable yeast vaginitis  Will give one dose of diflucan and can have terconazole bid externally

## 2012-11-14 ENCOUNTER — Other Ambulatory Visit: Payer: Self-pay | Admitting: *Deleted

## 2012-11-14 NOTE — Telephone Encounter (Signed)
Refill request

## 2012-11-18 MED ORDER — TOPIRAMATE 25 MG PO TABS: 25.0000 mg | ORAL_TABLET | Freq: Every day | ORAL | Status: DC

## 2012-11-18 MED ORDER — BUPROPION HCL 100 MG PO TABS: 100.0000 mg | ORAL_TABLET | Freq: Once | ORAL | Status: DC

## 2012-12-20 ENCOUNTER — Encounter: Payer: Self-pay | Admitting: Internal Medicine

## 2012-12-20 ENCOUNTER — Ambulatory Visit (INDEPENDENT_AMBULATORY_CARE_PROVIDER_SITE_OTHER): Payer: 59 | Admitting: Internal Medicine

## 2012-12-20 VITALS — BP 134/84 | HR 71 | Temp 98.0°F | Resp 16 | Ht 63.0 in | Wt 188.0 lb

## 2012-12-20 DIAGNOSIS — J329 Chronic sinusitis, unspecified: Secondary | ICD-10-CM

## 2012-12-20 MED ORDER — AZITHROMYCIN 250 MG PO TABS: ORAL_TABLET | ORAL | Status: DC

## 2012-12-20 MED ORDER — HYDROCOD POLST-CHLORPHEN POLST 10-8 MG/5ML PO LQCR: 5.0000 mL | Freq: Two times a day (BID) | ORAL | Status: DC | PRN

## 2012-12-20 NOTE — Patient Instructions (Addendum)
Take meds as prescribed  See me next week

## 2012-12-20 NOTE — Progress Notes (Signed)
Subjective:    Patient ID: Lauren Nielsen, female    DOB: 11-07-1976, 37 y.o.   MRN: [id number]  HPI  Lauren Nielsen is here for acute visit.  Several days of sinus congestion,  Green nasal discharge and now cough and chest congestion.  Cough productive of white sputum.  Some chills a few days ago. No documented fever no chest pain.    Pt did have an influenza vaccine in the fall  Allergies  Allergen Reactions  . Ppd (Tuberculin Purified Protein Derivative) Anaphylaxis  . Latex    Past Medical History  Diagnosis Date  . Endometriosis   . Depression   . Anxiety   . Hyperlipidemia   . Migraines   . Asperger's disorder    Past Surgical History  Procedure Date  . Laparotomy   . Laparoscopy   . Hernia repair   . Abdominal hysterectomy    History   Social History  . Marital Status: Married    Spouse Name: N/A    Number of Children: N/A  . Years of Education: N/A   Occupational History  . Airline pilot    Social History Main Topics  . Smoking status: Former Smoker -- .5 years    Types: Cigarettes  . Smokeless tobacco: Never Used  . Alcohol Use: No  . Drug Use: Yes     Comment: marijuana in 20's  . Sexually Active: Yes -- Female, Female partner(s)    Birth Control/ Protection: Surgical   Other Topics Concern  . Not on file   Social History Narrative  . No narrative on file   Family History  Problem Relation Age of Onset  . Cancer Mother     breast,uterine,liver,melanoma,  . Cancer Father     prostate and brain  . Depression Brother   . Hypothyroidism Brother    Patient Active Problem List  Diagnosis  . HYPERLIPIDEMIA  . DEPRESSION  . HYPERTENSION  . UNSPECIFIED SINUSITIS  . DYSPNEA ON EXERTION  . COUGH  . CHRONIC RHINITIS  . Endometriosis  . Family history of breast cancer in first degree relative  . History of migraine headaches  . Yeast vaginitis  . Sinusitis   Current Outpatient Prescriptions on File Prior to Visit  Medication Sig Dispense Refill  .  buPROPion (WELLBUTRIN) 100 MG tablet Take 1 tablet (100 mg total) by mouth once.  90 tablet  3  . citalopram (CELEXA) 20 MG tablet Take 1 tablet (20 mg total) by mouth daily.  30 tablet  2  . EPINEPHrine (EPIPEN) 0.3 mg/0.3 mL DEVI Inject 0.3 mLs (0.3 mg total) into the muscle as needed.  1 Device  0  . Fexofenadine HCl (ALLEGRA PO) Take 1 tablet by mouth daily.      Marland Kitchen terconazole (TERAZOL 3) 0.8 % vaginal cream Apply external vaginal area bid  20 g  1  . topiramate (TOPAMAX) 25 MG tablet Take 1 tablet (25 mg total) by mouth daily.  90 tablet  1      Review of Systems See HPI    Objective:   Physical Exam Physical Exam  Constitutional: She is oriented to person, place, and time. She appears well-developed and well-nourished. She is cooperative.  HENT:  Head: Normocephalic and atraumatic.  Right Ear: A middle ear effusion is present.  Left Ear: A middle ear effusion is present.  Nose: Mucosal edema present. Right sinus exhibits maxillary sinus tenderness. Left sinus exhibits maxillary sinus tenderness.  Mouth/Throat: Posterior oropharyngeal erythema present.  Serous effusion bilaterally  Eyes: Conjunctivae and EOM are normal. Pupils are equal, round, and reactive to light.  Neck: Neck supple. Carotid bruit is not present. No mass present.  Cardiovascular: Regular rhythm, normal heart sounds, intact distal pulses and normal pulses. Exam reveals no gallop and no friction rub.  No murmur heard.  Pulmonary/Chest: Breath sounds normal. She has no wheezes. She has no rhonchi. She has no rales.  Neurological: She is alert and oriented to person, place, and time.  Skin: Skin is warm and dry. No abrasion, no bruising, no ecchymosis and no rash noted. No cyanosis. Nails show no clubbing.  Psychiatric: She has a normal mood and affect. Her speech is normal and behavior is normal.              Assessment & Plan:  Sinusitis  Will give Zpak  Bronchitis with cough  OK for tussionex.  She  is to see me next week or sooner if no improvement

## 2012-12-27 ENCOUNTER — Ambulatory Visit: Payer: 59 | Admitting: Internal Medicine

## 2013-03-21 ENCOUNTER — Other Ambulatory Visit: Payer: Self-pay | Admitting: Internal Medicine

## 2013-03-23 ENCOUNTER — Other Ambulatory Visit: Payer: Self-pay | Admitting: Internal Medicine

## 2013-03-23 NOTE — Telephone Encounter (Signed)
Pt would like refill for Citalopram 20 mg called into pharmacy.  Pharmacy [facility] at [address] phone number [phone].  Pt call back phone number [phone].

## 2013-03-24 NOTE — Telephone Encounter (Signed)
Refill request

## 2013-03-26 MED ORDER — CITALOPRAM HYDROBROMIDE 20 MG PO TABS: 20.0000 mg | ORAL_TABLET | Freq: Every day | ORAL | Status: DC

## 2013-04-14 ENCOUNTER — Other Ambulatory Visit: Payer: Self-pay | Admitting: Sports Medicine

## 2013-04-14 DIAGNOSIS — M545 Low back pain: Secondary | ICD-10-CM

## 2013-04-15 ENCOUNTER — Ambulatory Visit
Admission: RE | Admit: 2013-04-15 | Discharge: 2013-04-15 | Disposition: A | Discharge status: Home / Self Care | Payer: 59 | Source: Ambulatory Visit | Attending: Sports Medicine | Admitting: Sports Medicine

## 2013-04-15 DIAGNOSIS — M545 Low back pain, unspecified: Secondary | ICD-10-CM

## 2013-05-25 ENCOUNTER — Other Ambulatory Visit: Payer: Self-pay | Admitting: Internal Medicine

## 2013-05-25 NOTE — Telephone Encounter (Signed)
Refill request

## 2013-08-30 ENCOUNTER — Ambulatory Visit (INDEPENDENT_AMBULATORY_CARE_PROVIDER_SITE_OTHER): Payer: BC Managed Care – PPO | Admitting: Internal Medicine

## 2013-08-30 ENCOUNTER — Encounter: Payer: Self-pay | Admitting: Internal Medicine

## 2013-08-30 VITALS — BP 128/86 | HR 100 | Temp 97.2°F | Resp 18 | Wt 200.0 lb

## 2013-08-30 DIAGNOSIS — R197 Diarrhea, unspecified: Secondary | ICD-10-CM

## 2013-08-30 DIAGNOSIS — Z90710 Acquired absence of both cervix and uterus: Secondary | ICD-10-CM

## 2013-08-30 LAB — CBC WITH DIFFERENTIAL/PLATELET
Basophils Absolute: 0 10*3/uL (ref 0.0–0.1)
Eosinophils Relative: 2 % (ref 0–5)
HCT: 37.9 % (ref 36.0–46.0)
Lymphocytes Relative: 35 % (ref 12–46)
Lymphs Abs: 2.2 10*3/uL (ref 0.7–4.0)
MCV: 90 fL (ref 78.0–100.0)
Monocytes Absolute: 0.5 10*3/uL (ref 0.1–1.0)
Neutro Abs: 3.4 10*3/uL (ref 1.7–7.7)
Platelets: 320 10*3/uL (ref 150–400)
RBC: 4.21 MIL/uL (ref 3.87–5.11)
RDW: 13.3 % (ref 11.5–15.5)
WBC: 6.3 10*3/uL (ref 4.0–10.5)

## 2013-08-30 LAB — COMPREHENSIVE METABOLIC PANEL
AST: 11 U/L (ref 0–37)
Albumin: 3.9 g/dL (ref 3.5–5.2)
Alkaline Phosphatase: 67 U/L (ref 39–117)
BUN: 9 mg/dL (ref 6–23)
Calcium: 9 mg/dL (ref 8.4–10.5)
Chloride: 104 mEq/L (ref 96–112)
Potassium: 3.9 mEq/L (ref 3.5–5.3)
Sodium: 136 mEq/L (ref 135–145)
Total Protein: 6.6 g/dL (ref 6.0–8.3)

## 2013-08-30 NOTE — Patient Instructions (Signed)
Hold all dairy in diet  No milk no yogurt no ice cream      Call office tomorrow for lab test results  Will refer you to GI md

## 2013-09-02 DIAGNOSIS — Z90710 Acquired absence of both cervix and uterus: Secondary | ICD-10-CM | POA: Insufficient documentation

## 2013-09-02 DIAGNOSIS — R197 Diarrhea, unspecified: Secondary | ICD-10-CM | POA: Insufficient documentation

## 2013-09-02 NOTE — Progress Notes (Signed)
Subjective:    Patient ID: Lauren Nielsen, female    DOB: [birth year], 37 y.o.   MRN: [id number]  HPI  Lauren Nielsen is here for acute visit.   She is concerned about persistant diarrhea that has been present and worsening over the last 2 months. It is associated with nausea, no vomiting, she had not seen frank blood or change in color of stools.  No documented fever  She describes GI issues that date back to high school.  She recalls having a colonoscopy in high school for some rectal bleeding.  She has a brother with Chron's .  Mother has breast cancer.  Father has prostate cancer.  Pt's last mm was in 05/2012  3-D mm negative at that time  She has abd cramping that is relieved with bowel movements.  No night time symptoms.  No recent travel or antibiotic use.    Allergies  Allergen Reactions  . Ppd [Tuberculin Purified Protein Derivative] Anaphylaxis  . Latex    Past Medical History  Diagnosis Date  . Endometriosis   . Depression   . Anxiety   . Hyperlipidemia   . Migraines   . Asperger's disorder    Past Surgical History  Procedure Laterality Date  . Laparotomy    . Laparoscopy    . Hernia repair    . Abdominal hysterectomy     History   Social History  . Marital Status: Married    Spouse Name: N/A    Number of Children: N/A  . Years of Education: N/A   Occupational History  . Airline pilot    Social History Main Topics  . Smoking status: Former Smoker -- .5 years    Types: Cigarettes  . Smokeless tobacco: Never Used  . Alcohol Use: No  . Drug Use: Yes     Comment: marijuana in 20's  . Sexual Activity: Yes    Partners: Female, Female    Birth Control/ Protection: Surgical   Other Topics Concern  . Not on file   Social History Narrative  . No narrative on file   Family History  Problem Relation Age of Onset  . Cancer Mother     breast,uterine,liver,melanoma,  . Cancer Father     prostate and brain  . Depression Brother   . Hypothyroidism Brother    Patient  Active Problem List   Diagnosis Date Noted  . Diarrhea 09/02/2013  . Sinusitis 12/20/2012  . Yeast vaginitis 10/25/2012  . History of migraine headaches 08/23/2012  . Endometriosis 06/30/2012  . Family history of breast cancer in first degree relative 06/30/2012  . CHRONIC RHINITIS 12/15/2010  . COUGH 10/22/2010  . HYPERTENSION 10/10/2010  . UNSPECIFIED SINUSITIS 10/10/2010  . DYSPNEA ON EXERTION 10/10/2010  . HYPERLIPIDEMIA 10/09/2010  . DEPRESSION 10/09/2010   Current Outpatient Prescriptions on File Prior to Visit  Medication Sig Dispense Refill  . buPROPion (WELLBUTRIN) 100 MG tablet Take 1 tablet (100 mg total) by mouth once.  90 tablet  3  . citalopram (CELEXA) 20 MG tablet Take 1 tablet (20 mg total) by mouth daily.  30 tablet  3  . Fexofenadine HCl (ALLEGRA PO) Take 1 tablet by mouth daily.      Marland Kitchen topiramate (TOPAMAX) 25 MG tablet TAKE 1 TABLET (25 MG TOTAL) BY MOUTH DAILY.  90 tablet  1  . EPINEPHrine (EPIPEN) 0.3 mg/0.3 mL DEVI Inject 0.3 mLs (0.3 mg total) into the muscle as needed.  1 Device  0   No current  facility-administered medications on file prior to visit.      Review of Systems See HPI    Objective:   Physical Exam Physical Exam  Nursing note and vitals reviewed.  Constitutional: She is oriented to person, place, and time. She appears well-developed and well-nourished.  HENT:  Head: Normocephalic and atraumatic.  Cardiovascular: Normal rate and regular rhythm. Exam reveals no gallop and no friction rub.  No murmur heard.  Pulmonary/Chest: Breath sounds normal. She has no wheezes. She has no rales.  Abd soft BS +  No HSM  No tenderness to deep palpation in all quadrants.  No peritoneal signs.  Rectal no mass guaiac neg.   Neurological: She is alert and oriented to person, place, and time.  Skin: Skin is warm and dry.  Psychiatric: She has a normal mood and affect. Her behavior is normal.             Assessment & Plan:  Chronic intermittant  diarrhea for past 2 months  Will get stool for C diff,  Culture.  Infectious etiology low on differential but must consider inflammatory vs IBS.  With FH of Chron's in her brother,  Will refer for further eval with GI and possible repeat colonoscopy  Check labs today     Avoid all dairy in diet for now  2)  FH of breast CA in mother  Will need yearly mm.  Mother died at age 93 of breast cancer

## 2013-09-04 ENCOUNTER — Encounter: Payer: Self-pay | Admitting: *Deleted

## 2013-09-04 ENCOUNTER — Telehealth: Payer: Self-pay | Admitting: *Deleted

## 2013-09-04 NOTE — Telephone Encounter (Signed)
Pt returned call states that she is doing much better has limited certin foods and is now back to normal

## 2013-09-04 NOTE — Telephone Encounter (Signed)
Message copied by Mathews Robinsons on Mon Sep 04, 2013  9:21 AM ------      Message from: Raechel Chute D      Created: Mon Sep 04, 2013  8:01 AM       Ok to mail to pt ------

## 2013-09-05 ENCOUNTER — Other Ambulatory Visit: Payer: Self-pay

## 2013-09-05 DIAGNOSIS — Z1231 Encounter for screening mammogram for malignant neoplasm of breast: Secondary | ICD-10-CM

## 2013-09-09 LAB — STOOL CULTURE

## 2013-09-25 ENCOUNTER — Encounter: Payer: 59 | Admitting: Internal Medicine

## 2013-10-04 ENCOUNTER — Ambulatory Visit (INDEPENDENT_AMBULATORY_CARE_PROVIDER_SITE_OTHER): Payer: BC Managed Care – PPO | Admitting: Internal Medicine

## 2013-10-04 ENCOUNTER — Encounter: Payer: Self-pay | Admitting: Internal Medicine

## 2013-10-04 ENCOUNTER — Ambulatory Visit
Admission: RE | Admit: 2013-10-04 | Discharge: 2013-10-04 | Disposition: A | Discharge status: Home / Self Care | Payer: BC Managed Care – PPO | Source: Ambulatory Visit

## 2013-10-04 VITALS — BP 133/89 | HR 66 | Temp 98.4°F | Resp 18 | Wt 200.0 lb

## 2013-10-04 DIAGNOSIS — E785 Hyperlipidemia, unspecified: Secondary | ICD-10-CM

## 2013-10-04 DIAGNOSIS — R197 Diarrhea, unspecified: Secondary | ICD-10-CM

## 2013-10-04 DIAGNOSIS — Z1231 Encounter for screening mammogram for malignant neoplasm of breast: Secondary | ICD-10-CM

## 2013-10-04 LAB — LIPID PANEL
HDL: 39 mg/dL — ABNORMAL LOW (ref >39)
LDL Cholesterol: 172 mg/dL — ABNORMAL HIGH (ref 0–99)
Total CHOL/HDL Ratio: 6 Ratio
Triglycerides: 112 mg/dL (ref <150)
VLDL: 22 mg/dL (ref 0–40)

## 2013-10-04 MED ORDER — EPINEPHRINE 0.3 MG/0.3ML IJ SOAJ: INTRAMUSCULAR | Status: AC

## 2013-10-04 MED ORDER — CITALOPRAM HYDROBROMIDE 20 MG PO TABS: 20.0000 mg | ORAL_TABLET | Freq: Every day | ORAL | Status: DC

## 2013-10-04 MED ORDER — TOPIRAMATE 25 MG PO TABS: ORAL_TABLET | ORAL | Status: DC

## 2013-10-04 MED ORDER — BUPROPION HCL 100 MG PO TABS: 100.0000 mg | ORAL_TABLET | Freq: Once | ORAL | Status: DC

## 2013-10-04 NOTE — Progress Notes (Signed)
Subjective:    Patient ID: Lauren Nielsen, female    DOB: 05/29/1976, 37 y.o.   MRN: [id number]  HPI Lauren Nielsen  Is here for Follow up.  She did see Dr. Loreta Ave  Who recommended and upper and lower GI eval   But pt states her cost would be $700.00   She has changed her diet and is having less symptoms  She is aware that she may have an undiagnosed chron's or cancer .   No blood in stool and diarrhea has improved.  Bacterial cultures negative  She is now married and has a named change  Allergies  Allergen Reactions  . Ppd [Tuberculin Purified Protein Derivative] Anaphylaxis  . Latex    Past Medical History  Diagnosis Date  . Endometriosis   . Depression   . Anxiety   . Hyperlipidemia   . Migraines   . Asperger's disorder    Past Surgical History  Procedure Laterality Date  . Laparotomy    . Laparoscopy    . Hernia repair    . Abdominal hysterectomy     History   Social History  . Marital Status: Married    Spouse Name: N/A    Number of Children: N/A  . Years of Education: N/A   Occupational History  . Airline pilot    Social History Main Topics  . Smoking status: Former Smoker -- .5 years    Types: Cigarettes  . Smokeless tobacco: Never Used  . Alcohol Use: No  . Drug Use: Yes     Comment: marijuana in 20's  . Sexual Activity: Yes    Partners: Female, Female    Birth Control/ Protection: Surgical   Other Topics Concern  . Not on file   Social History Narrative  . No narrative on file   Family History  Problem Relation Age of Onset  . Cancer Mother     breast,uterine,liver,melanoma,  . Cancer Father     prostate and brain  . Depression Brother   . Hypothyroidism Brother    Patient Active Problem List   Diagnosis Date Noted  . Diarrhea 09/02/2013  . S/P hysterectomy 09/02/2013  . Sinusitis 12/20/2012  . Yeast vaginitis 10/25/2012  . History of migraine headaches 08/23/2012  . Endometriosis 06/30/2012  . Family history of breast cancer in first degree  relative 06/30/2012  . CHRONIC RHINITIS 12/15/2010  . COUGH 10/22/2010  . HYPERTENSION 10/10/2010  . UNSPECIFIED SINUSITIS 10/10/2010  . DYSPNEA ON EXERTION 10/10/2010  . HYPERLIPIDEMIA 10/09/2010  . DEPRESSION 10/09/2010   Current Outpatient Prescriptions on File Prior to Visit  Medication Sig Dispense Refill  . EPINEPHrine (EPIPEN) 0.3 mg/0.3 mL DEVI Inject 0.3 mLs (0.3 mg total) into the muscle as needed.  1 Device  0  . Fexofenadine HCl (ALLEGRA PO) Take 1 tablet by mouth daily.       No current facility-administered medications on file prior to visit.      Review of Systems See HPI    Objective:   Physical Exam Physical Exam  Nursing note and vitals reviewed.  Constitutional: She is oriented to person, place, and time. She appears well-developed and well-nourished.  HENT:  Head: Normocephalic and atraumatic.  Cardiovascular: Normal rate and regular rhythm. Exam reveals no gallop and no friction rub.  No murmur heard.  Pulmonary/Chest: Breath sounds normal. She has no wheezes. She has no rales.  Neurological: She is alert and oriented to person, place, and time.  Skin: Skin is warm and dry.  Psychiatric: She has a normal mood and affect. Her behavior is normal.              Assessment & Plan:  Diarrhea  Some improvememt with diet.  I advised that she needs endoscopic work up and she sdtates she will try when she can afford.  Hyperlipidemia  Will check today   Schedule CPE with me

## 2013-10-04 NOTE — Patient Instructions (Signed)
Blood pressure guideline  Top number < 140  Bottom number < 90  See me as needed  Schedule CPe for sometime in 2015

## 2013-10-09 ENCOUNTER — Telehealth: Payer: Self-pay | Admitting: *Deleted

## 2013-10-09 NOTE — Telephone Encounter (Signed)
Notified pt of negative MM results.

## 2013-10-10 ENCOUNTER — Telehealth: Payer: Self-pay | Admitting: *Deleted

## 2013-10-10 NOTE — Telephone Encounter (Signed)
Notified pt of cholesterol appt made for 11/19

## 2013-10-10 NOTE — Telephone Encounter (Signed)
Message copied by Mathews Robinsons on Tue Oct 10, 2013  1:44 PM ------      Message from: Raechel Chute D      Created: Thu Oct 05, 2013  2:06 PM       Karen Kitchens            Call pt and give her a 30 min appt to see me let her know her bad cholesterol is high and I need to talk with her  OK to mail to pt  I placed on your desk ------

## 2013-10-11 ENCOUNTER — Ambulatory Visit (INDEPENDENT_AMBULATORY_CARE_PROVIDER_SITE_OTHER): Payer: BC Managed Care – PPO | Admitting: Internal Medicine

## 2013-10-11 ENCOUNTER — Encounter: Payer: Self-pay | Admitting: Internal Medicine

## 2013-10-11 VITALS — BP 121/76 | HR 83 | Temp 98.7°F | Resp 18 | Wt 199.0 lb

## 2013-10-11 DIAGNOSIS — E785 Hyperlipidemia, unspecified: Secondary | ICD-10-CM

## 2013-10-11 MED ORDER — ATORVASTATIN CALCIUM 10 MG PO TABS: ORAL_TABLET | ORAL | Status: DC

## 2013-10-11 NOTE — Progress Notes (Signed)
Subjective:    Patient ID: Lauren Nielsen, female    DOB: 09-30-1976, 37 y.o.   MRN: [id number]  HPI  See labs.  Pt tells me her levels have been like this for at least 10 years.    Father on medication  She does follow a vegan diet and exercises with a trainer  She is a nonsmoker  Allergies  Allergen Reactions  . Ppd [Tuberculin Purified Protein Derivative] Anaphylaxis  . Latex    Past Medical History  Diagnosis Date  . Endometriosis   . Depression   . Anxiety   . Hyperlipidemia   . Migraines   . Asperger's disorder    Past Surgical History  Procedure Laterality Date  . Laparotomy    . Laparoscopy    . Hernia repair    . Abdominal hysterectomy     History   Social History  . Marital Status: Married    Spouse Name: N/A    Number of Children: N/A  . Years of Education: N/A   Occupational History  . Airline pilot    Social History Main Topics  . Smoking status: Former Smoker -- .5 years    Types: Cigarettes  . Smokeless tobacco: Never Used  . Alcohol Use: No  . Drug Use: Yes     Comment: marijuana in 20's  . Sexual Activity: Yes    Partners: Female, Female    Birth Control/ Protection: Surgical   Other Topics Concern  . Not on file   Social History Narrative  . No narrative on file   Family History  Problem Relation Age of Onset  . Cancer Mother     breast,uterine,liver,melanoma,  . Cancer Father     prostate and brain  . Depression Brother   . Hypothyroidism Brother    Patient Active Problem List   Diagnosis Date Noted  . Diarrhea 09/02/2013  . S/P hysterectomy 09/02/2013  . Sinusitis 12/20/2012  . Yeast vaginitis 10/25/2012  . History of migraine headaches 08/23/2012  . Endometriosis 06/30/2012  . Family history of breast cancer in first degree relative 06/30/2012  . CHRONIC RHINITIS 12/15/2010  . COUGH 10/22/2010  . HYPERTENSION 10/10/2010  . UNSPECIFIED SINUSITIS 10/10/2010  . DYSPNEA ON EXERTION 10/10/2010  . HYPERLIPIDEMIA 10/09/2010   . DEPRESSION 10/09/2010   Current Outpatient Prescriptions on File Prior to Visit  Medication Sig Dispense Refill  . buPROPion (WELLBUTRIN) 100 MG tablet Take 1 tablet (100 mg total) by mouth once.  90 tablet  3  . citalopram (CELEXA) 20 MG tablet Take 1 tablet (20 mg total) by mouth daily.  30 tablet  3  . EPINEPHrine (EPI-PEN) 0.3 mg/0.3 mL SOAJ injection Inject per instruction for severe allergic reaction  1 Device  1  . Fexofenadine HCl (ALLEGRA PO) Take 1 tablet by mouth daily.      Marland Kitchen loratadine-pseudoephedrine (CLARITIN-D 24-HOUR) 10-240 MG per 24 hr tablet Take 1 tablet by mouth daily.      Marland Kitchen topiramate (TOPAMAX) 25 MG tablet Take one daily  90 tablet  1  . EPINEPHrine (EPIPEN) 0.3 mg/0.3 mL DEVI Inject 0.3 mLs (0.3 mg total) into the muscle as needed.  1 Device  0   No current facility-administered medications on file prior to visit.       Review of Systems See HPI    Objective:   Physical Exam  Physical Exam  Nursing note and vitals reviewed.  Constitutional: She is oriented to person, place, and time. She appears well-developed and  well-nourished.  HENT:  Head: Normocephalic and atraumatic.  Cardiovascular: Normal rate and regular rhythm. Exam reveals no gallop and no friction rub.  No murmur heard.  Pulmonary/Chest: Breath sounds normal. She has no wheezes. She has no rales.  Neurological: She is alert and oriented to person, place, and time.  Skin: Skin is warm and dry.  Psychiatric: She has a normal mood and affect. Her behavior is normal.         Assessment & Plan:  Pt reports she knows there is a genetic component to this.  She has healthy lifestyle habits which will help with her prevention .  Seh would like to start on a low dose statin.   Will start Lipitor 10 mg qod.  Advised to let me know if any muscle symptoms.   Also advised about lft elevation.   She will see me in February for CPE and will check lipid/liver then

## 2013-12-27 ENCOUNTER — Encounter: Payer: Self-pay | Admitting: Internal Medicine

## 2013-12-27 ENCOUNTER — Ambulatory Visit (INDEPENDENT_AMBULATORY_CARE_PROVIDER_SITE_OTHER): Payer: 59 | Admitting: Internal Medicine

## 2013-12-27 VITALS — BP 123/83 | HR 76 | Temp 98.3°F | Resp 18 | Wt 203.0 lb

## 2013-12-27 DIAGNOSIS — Z803 Family history of malignant neoplasm of breast: Secondary | ICD-10-CM

## 2013-12-27 DIAGNOSIS — F329 Major depressive disorder, single episode, unspecified: Secondary | ICD-10-CM

## 2013-12-27 DIAGNOSIS — I10 Essential (primary) hypertension: Secondary | ICD-10-CM

## 2013-12-27 DIAGNOSIS — F3289 Other specified depressive episodes: Secondary | ICD-10-CM

## 2013-12-27 DIAGNOSIS — Z90710 Acquired absence of both cervix and uterus: Secondary | ICD-10-CM

## 2013-12-27 DIAGNOSIS — Z0000 Encounter for general adult medical examination without abnormal findings: Secondary | ICD-10-CM

## 2013-12-27 DIAGNOSIS — E669 Obesity, unspecified: Secondary | ICD-10-CM

## 2013-12-27 DIAGNOSIS — E785 Hyperlipidemia, unspecified: Secondary | ICD-10-CM

## 2013-12-27 LAB — POCT URINALYSIS DIPSTICK
BILIRUBIN UA: NEGATIVE
GLUCOSE UA: NEGATIVE
Ketones, UA: NEGATIVE
Leukocytes, UA: NEGATIVE
NITRITE UA: NEGATIVE
Protein, UA: NEGATIVE
Spec Grav, UA: 1.015
Urobilinogen, UA: NEGATIVE
pH, UA: 6

## 2013-12-27 LAB — LIPID PANEL
CHOLESTEROL: 194 mg/dL (ref 0–200)
HDL: 45 mg/dL (ref >39)
LDL Cholesterol: 120 mg/dL — ABNORMAL HIGH (ref 0–99)
TRIGLYCERIDES: 146 mg/dL (ref <150)
Total CHOL/HDL Ratio: 4.3 Ratio
VLDL: 29 mg/dL (ref 0–40)

## 2013-12-27 LAB — COMPREHENSIVE METABOLIC PANEL
ALT: 15 U/L (ref 0–35)
AST: 14 U/L (ref 0–37)
Albumin: 4.1 g/dL (ref 3.5–5.2)
Alkaline Phosphatase: 76 U/L (ref 39–117)
BILIRUBIN TOTAL: 0.5 mg/dL (ref 0.2–1.2)
BUN: 10 mg/dL (ref 6–23)
CALCIUM: 9.2 mg/dL (ref 8.4–10.5)
CHLORIDE: 103 meq/L (ref 96–112)
CO2: 24 mEq/L (ref 19–32)
CREATININE: 0.57 mg/dL (ref 0.50–1.10)
Glucose, Bld: 96 mg/dL (ref 70–99)
Potassium: 3.9 mEq/L (ref 3.5–5.3)
Sodium: 135 mEq/L (ref 135–145)
Total Protein: 7.1 g/dL (ref 6.0–8.3)

## 2013-12-27 NOTE — Progress Notes (Signed)
Subjective:    Patient ID: Lauren Nielsen, female    DOB: Dec 08, 1975, 38 y.o.   MRN: [id number]  HPI Lauren Nielsen is here for CPE  She is having knee pain and this is frustrating as she cannot run .  She has seen an orthopedic for this but really has not had any imaging other than plain films  Her mm in November is negative  Strong FH of premenopausal breast cancer.  I discussed BRCA with her  Hyperlipidemia  She is on low dose lipitor.  She denies any myalgias except for when she has a heavy exercise workout.  History of skin cancer:  She has seen Dr. Ronnald Ramp her dermatologist  Allergies  Allergen Reactions  . Ppd [Tuberculin Purified Protein Derivative] Anaphylaxis  . Latex    Past Medical History  Diagnosis Date  . Endometriosis   . Depression   . Anxiety   . Hyperlipidemia   . Migraines   . Asperger's disorder    Past Surgical History  Procedure Laterality Date  . Laparotomy    . Laparoscopy    . Hernia repair    . Abdominal hysterectomy     History   Social History  . Marital Status: Married    Spouse Name: N/A    Number of Children: N/A  . Years of Education: N/A   Occupational History  . Designer, industrial/product    Social History Main Topics  . Smoking status: Former Smoker -- .5 years    Types: Cigarettes  . Smokeless tobacco: Never Used  . Alcohol Use: No  . Drug Use: Yes     Comment: marijuana in 20's  . Sexual Activity: Yes    Partners: Female, Female    Birth Control/ Protection: Surgical   Other Topics Concern  . Not on file   Social History Narrative  . No narrative on file   Family History  Problem Relation Age of Onset  . Cancer Mother     breast,uterine,liver,melanoma,  . Cancer Father     prostate and brain  . Depression Brother   . Hypothyroidism Brother    Patient Active Problem List   Diagnosis Date Noted  . Obesity 12/27/2013  . Diarrhea 09/02/2013  . S/P hysterectomy 09/02/2013  . Sinusitis 12/20/2012  . Yeast vaginitis 10/25/2012  .  History of migraine headaches 08/23/2012  . Endometriosis 06/30/2012  . Family history of breast cancer in first degree relative 06/30/2012  . CHRONIC RHINITIS 12/15/2010  . COUGH 10/22/2010  . HYPERTENSION 10/10/2010  . UNSPECIFIED SINUSITIS 10/10/2010  . DYSPNEA ON EXERTION 10/10/2010  . HYPERLIPIDEMIA 10/09/2010  . DEPRESSION 10/09/2010   Current Outpatient Prescriptions on File Prior to Visit  Medication Sig Dispense Refill  . atorvastatin (LIPITOR) 10 MG tablet Take one tablet qod  45 tablet  0  . buPROPion (WELLBUTRIN) 100 MG tablet Take 1 tablet (100 mg total) by mouth once.  90 tablet  3  . citalopram (CELEXA) 20 MG tablet Take 1 tablet (20 mg total) by mouth daily.  30 tablet  3  . loratadine-pseudoephedrine (CLARITIN-D 24-HOUR) 10-240 MG per 24 hr tablet Take 1 tablet by mouth daily.      Marland Kitchen topiramate (TOPAMAX) 25 MG tablet Take one daily  90 tablet  1  . EPINEPHrine (EPI-PEN) 0.3 mg/0.3 mL SOAJ injection Inject per instruction for severe allergic reaction  1 Device  1  . Fexofenadine HCl (ALLEGRA PO) Take 1 tablet by mouth daily.  No current facility-administered medications on file prior to visit.       Review of Systems  Respiratory: Negative for cough, chest tightness and shortness of breath.   Cardiovascular: Negative for chest pain.  Musculoskeletal: Positive for arthralgias. Negative for joint swelling.  All other systems reviewed and are negative.       Objective:   Physical Exam Physical Exam  Nursing note and vitals reviewed.  Constitutional: She is oriented to person, place, and time. She appears well-developed and well-nourished.  HENT:  Head: Normocephalic and atraumatic.  Right Ear: Tympanic membrane and ear canal normal. No drainage. Tympanic membrane is not injected and not erythematous.  Left Ear: Tympanic membrane and ear canal normal. No drainage. Tympanic membrane is not injected and not erythematous.  Nose: Nose normal. Right sinus  exhibits no maxillary sinus tenderness and no frontal sinus tenderness. Left sinus exhibits no maxillary sinus tenderness and no frontal sinus tenderness.  Mouth/Throat: Oropharynx is clear and moist. No oral lesions. No oropharyngeal exudate.  Eyes: Conjunctivae and EOM are normal. Pupils are equal, round, and reactive to light.  Neck: Normal range of motion. Neck supple. No JVD present. Carotid bruit is not present. No mass and no thyromegaly present.  Cardiovascular: Normal rate, regular rhythm, S1 normal, S2 normal and intact distal pulses. Exam reveals no gallop and no friction rub.  No murmur heard.  Pulses:  Carotid pulses are 2+ on the right side, and 2+ on the left side.  Dorsalis pedis pulses are 2+ on the right side, and 2+ on the left side.  No carotid bruit. No LE edema  Pulmonary/Chest: Breath sounds normal. She has no wheezes. She has no rales. She exhibits no tenderness.   Breast no discrete mass no nipple discharge no axillar adenopathy bilaterally Abdominal: Soft. Bowel sounds are normal. She exhibits no distension and no mass. There is no hepatosplenomegaly. There is no tenderness. There is no CVA tenderness.  Musculoskeletal: Normal range of motion.  No active synovitis to joints.  Lymphadenopathy:  She has no cervical adenopathy.  She has no axillary adenopathy.  Right: No inguinal and no supraclavicular adenopathy present.  Left: No inguinal and no supraclavicular adenopathy present.  Neurological: She is alert and oriented to person, place, and time. She has normal strength and normal reflexes. She displays no tremor. No cranial nerve deficit or sensory deficit. Coordination and gait normal.  Skin: Skin is warm and dry. No rash noted. No cyanosis. Nails show no clubbing.  Multiple tatoos Psychiatric: She has a normal mood and affect. Her speech is normal and behavior is normal. Cognition and memory are normal.          Assessment & Plan:  Health Maintenance   DASH  diet given.  She is a non-smoker.  Advised to be sure to get yearly mm due in November  Strong FH of premenopausal breast cancer.   Given  Number for [facility] for genetic counseling and I advised BRCA .  She will call genetic counselor  HTN:  Well controlled  Hyperlipidemia  Will check labs today  No myalgias.  firther management based on results  Obesity  Advised diet exercise.    Knee pain:  Advised to make appt with orthopedic  Depresssion continue WEllbutrin    See me as needed

## 2013-12-27 NOTE — Patient Instructions (Signed)
Make appointment with orthopedic Md for your knee  Be sure to get your mammogram in November    Will mail labs to you

## 2013-12-28 LAB — VITAMIN D 25 HYDROXY (VIT D DEFICIENCY, FRACTURES): Vit D, 25-Hydroxy: 22 ng/mL — ABNORMAL LOW (ref 30–89)

## 2013-12-29 ENCOUNTER — Other Ambulatory Visit: Payer: Self-pay | Admitting: *Deleted

## 2013-12-29 MED ORDER — FLUCONAZOLE 150 MG PO TABS: 150.0000 mg | ORAL_TABLET | Freq: Once | ORAL | Status: DC

## 2013-12-29 NOTE — Telephone Encounter (Signed)
Diflucan sent in per pt request and Dr Sharin Mons VO

## 2014-01-02 ENCOUNTER — Encounter: Payer: Self-pay | Admitting: *Deleted

## 2014-01-19 ENCOUNTER — Other Ambulatory Visit: Payer: Self-pay | Admitting: Internal Medicine

## 2014-01-22 NOTE — Telephone Encounter (Signed)
Refill request labs UTD 2/15

## 2014-01-25 ENCOUNTER — Ambulatory Visit (INDEPENDENT_AMBULATORY_CARE_PROVIDER_SITE_OTHER): Payer: 59 | Admitting: Internal Medicine

## 2014-01-25 ENCOUNTER — Encounter: Payer: Self-pay | Admitting: Internal Medicine

## 2014-01-25 VITALS — BP 128/89 | HR 92 | Temp 98.0°F | Resp 19 | Wt 200.0 lb

## 2014-01-25 DIAGNOSIS — R059 Cough, unspecified: Secondary | ICD-10-CM

## 2014-01-25 DIAGNOSIS — R6889 Other general symptoms and signs: Secondary | ICD-10-CM

## 2014-01-25 DIAGNOSIS — M791 Myalgia, unspecified site: Secondary | ICD-10-CM

## 2014-01-25 DIAGNOSIS — R05 Cough: Secondary | ICD-10-CM

## 2014-01-25 DIAGNOSIS — IMO0001 Reserved for inherently not codable concepts without codable children: Secondary | ICD-10-CM

## 2014-01-25 DIAGNOSIS — J111 Influenza due to unidentified influenza virus with other respiratory manifestations: Secondary | ICD-10-CM

## 2014-01-25 LAB — POCT INFLUENZA A/B: Influenza A, POC: POSITIVE

## 2014-01-25 MED ORDER — HYDROCOD POLST-CHLORPHEN POLST 10-8 MG/5ML PO LQCR: 5.0000 mL | Freq: Two times a day (BID) | ORAL | Status: DC | PRN

## 2014-01-25 MED ORDER — OSELTAMIVIR PHOSPHATE 75 MG PO CAPS: 75.0000 mg | ORAL_CAPSULE | Freq: Two times a day (BID) | ORAL | Status: DC

## 2014-01-25 NOTE — Progress Notes (Signed)
Subjective:    Patient ID: Lauren Nielsen, female    DOB: 11-Oct-1976, 38 y.o.   MRN: [id number]  HPI  Lauren Nielsen is here for acute visit.  Exposed to partner with confirmed influenza and pt was given only 5 day course of Tamiflu  Pt began with flu symptoms  2 days ago .  Fever at home  Cough productive of thick mucous  Muscle aches  Allergies  Allergen Reactions  . Ppd [Tuberculin Purified Protein Derivative] Anaphylaxis  . Latex    Past Medical History  Diagnosis Date  . Endometriosis   . Depression   . Anxiety   . Hyperlipidemia   . Migraines   . Asperger's disorder    Past Surgical History  Procedure Laterality Date  . Laparotomy    . Laparoscopy    . Hernia repair    . Abdominal hysterectomy     History   Social History  . Marital Status: Married    Spouse Name: N/A    Number of Children: N/A  . Years of Education: N/A   Occupational History  . Designer, industrial/product    Social History Main Topics  . Smoking status: Former Smoker -- .5 years    Types: Cigarettes  . Smokeless tobacco: Never Used  . Alcohol Use: No  . Drug Use: Yes     Comment: marijuana in 20's  . Sexual Activity: Yes    Partners: Female, Female    Birth Control/ Protection: Surgical   Other Topics Concern  . Not on file   Social History Narrative  . No narrative on file   Family History  Problem Relation Age of Onset  . Cancer Mother     breast,uterine,liver,melanoma,  . Cancer Father     prostate and brain  . Depression Brother   . Hypothyroidism Brother    Patient Active Problem List   Diagnosis Date Noted  . Obesity 12/27/2013  . Diarrhea 09/02/2013  . S/P hysterectomy 09/02/2013  . Sinusitis 12/20/2012  . Yeast vaginitis 10/25/2012  . History of migraine headaches 08/23/2012  . Endometriosis 06/30/2012  . Family history of breast cancer in first degree relative 06/30/2012  . CHRONIC RHINITIS 12/15/2010  . COUGH 10/22/2010  . HYPERTENSION 10/10/2010  . UNSPECIFIED SINUSITIS  10/10/2010  . DYSPNEA ON EXERTION 10/10/2010  . HYPERLIPIDEMIA 10/09/2010  . DEPRESSION 10/09/2010   Current Outpatient Prescriptions on File Prior to Visit  Medication Sig Dispense Refill  . buPROPion (WELLBUTRIN) 100 MG tablet Take 1 tablet (100 mg total) by mouth once.  90 tablet  3  . citalopram (CELEXA) 20 MG tablet Take 1 tablet (20 mg total) by mouth daily.  30 tablet  3  . Fish Oil-Cholecalciferol (FISH OIL + D3 PO) Take 1 tablet by mouth daily.      Marland Kitchen loratadine-pseudoephedrine (CLARITIN-D 24-HOUR) 10-240 MG per 24 hr tablet Take 1 tablet by mouth daily.      Marland Kitchen topiramate (TOPAMAX) 25 MG tablet Take one daily  90 tablet  1  . atorvastatin (LIPITOR) 10 MG tablet TAKE ONE TABLET BY MOUTH EVERY OTHER DAY   45 tablet  0  . EPINEPHrine (EPI-PEN) 0.3 mg/0.3 mL SOAJ injection Inject per instruction for severe allergic reaction  1 Device  1   No current facility-administered medications on file prior to visit.     Review of Systems    see HPI Objective:   Physical Exam Physical Exam  Nursing note and vitals reviewed.  Constitutional: She is oriented  to person, place, and time. She appears well-developed and well-nourished. She is cooperative.  HENT:  Head: Normocephalic and atraumatic.  Nose: Mucosal edema present.  Eyes: Conjunctivae and EOM are normal. Pupils are equal, round, and reactive to light.  Neck: Neck supple.  Cardiovascular: Regular rhythm, normal heart sounds, intact distal pulses and normal pulses. Exam reveals no gallop and no friction rub.  No murmur heard.  Pulmonary/Chest: She has no wheezes. She has rhonchi. She has no rales.  Neurological: She is alert and oriented to person, place, and time.  Skin: Skin is warm and dry. No abrasion, no bruising, no ecchymosis and no rash noted. No cyanosis. Nails show no clubbing.  Psychiatric: She has a normal mood and affect. Her speech is normal and behavior is normal.           Assessment & Plan:  Influenza   Rapid test pos.  Will give tamiflu bid 5 days  Cough  Tussionez q12h  Myalgias :   OTC NSAID of choice.

## 2014-05-07 ENCOUNTER — Other Ambulatory Visit: Payer: Self-pay | Admitting: Internal Medicine

## 2014-05-08 NOTE — Telephone Encounter (Signed)
Rx Refill Request  Requested Medications     Medication name:  Name from pharmacy:  citalopram (CELEXA) 20 MG tablet  Citalopram Hydrobromide Oral Tablet 20 MG    Sig: TAKE ONE TABLET BY MOUTH ONE TIME DAILY     Dispense: 30 tablet (Pharmacy requested 30) Refills: 2 Start: 05/07/2014  Class: Normal    Requested on: 10/04/2013    Originally ordered on: 01/14/2012 Last refill: 02/09/2014 Order History and Details

## 2014-06-21 ENCOUNTER — Other Ambulatory Visit: Payer: Self-pay | Admitting: Internal Medicine

## 2014-06-21 NOTE — Telephone Encounter (Signed)
Requested Medications     Medication name:  Name from pharmacy:  atorvastatin (LIPITOR) 10 MG tablet  Atorvastatin Calcium Oral Tablet 10 MG    Sig: TAKE ONE TABLET BY MOUTH EVERY OTHER DAY     Dispense: 45 tablet (Pharmacy requested 45) Start: 06/21/2014  Class: Normal    Requested on: 01/23/2014    Originally ordered on: 10/11/2013 Last refill: 01/23/2014 Order History and Details

## 2014-06-25 ENCOUNTER — Other Ambulatory Visit: Payer: Self-pay | Admitting: *Deleted

## 2014-06-25 NOTE — Telephone Encounter (Signed)
Received fax from [facility]

## 2014-06-26 MED ORDER — ATORVASTATIN CALCIUM 10 MG PO TABS: ORAL_TABLET | ORAL | Status: DC

## 2014-07-06 ENCOUNTER — Telehealth: Payer: Self-pay | Admitting: *Deleted

## 2014-07-06 NOTE — Telephone Encounter (Signed)
1 tab of 150 mg of DIFLUCAN called to [facility], ok per provider.

## 2014-07-06 NOTE — Telephone Encounter (Signed)
Pt called in reporting that she went to a urgent care a couple of day's ago for a sinus  Infection and was given a RX for Amoxicillin which has now caused a yeast infection.Was calling to ask for Diflucan  Since you have gave it to her in the past .Told her you were out of the office till Monday but may check in.

## 2014-09-19 ENCOUNTER — Telehealth: Payer: Self-pay

## 2014-09-19 NOTE — Telephone Encounter (Signed)
Appointment- Dr. Collier Salina 09/26/14@8 :15am-eh

## 2014-09-19 NOTE — Telephone Encounter (Signed)
Brigett Garfinkle [phone]  [place] called and wants a referral to Dr Ferdinand Lango at [facility], she now has insurance that will pay, when we did the referral back in 08/2013 her insurance would not pay for colonoscopy, but it will now.

## 2014-12-21 ENCOUNTER — Other Ambulatory Visit: Payer: Self-pay | Admitting: *Deleted

## 2015-01-02 ENCOUNTER — Encounter: Payer: Self-pay | Admitting: *Deleted

## 2015-01-02 ENCOUNTER — Encounter: Payer: Self-pay | Admitting: Internal Medicine

## 2015-01-02 ENCOUNTER — Ambulatory Visit (INDEPENDENT_AMBULATORY_CARE_PROVIDER_SITE_OTHER): Payer: 59 | Admitting: Internal Medicine

## 2015-01-02 ENCOUNTER — Telehealth: Payer: Self-pay | Admitting: Internal Medicine

## 2015-01-02 VITALS — BP 132/81 | HR 85 | Resp 16 | Ht 63.0 in | Wt 214.0 lb

## 2015-01-02 DIAGNOSIS — F329 Major depressive disorder, single episode, unspecified: Secondary | ICD-10-CM

## 2015-01-02 DIAGNOSIS — Z23 Encounter for immunization: Secondary | ICD-10-CM

## 2015-01-02 DIAGNOSIS — R635 Abnormal weight gain: Secondary | ICD-10-CM

## 2015-01-02 DIAGNOSIS — F32A Depression, unspecified: Secondary | ICD-10-CM

## 2015-01-02 MED ORDER — BUPROPION HCL 75 MG PO TABS: ORAL_TABLET | ORAL | Status: DC

## 2015-01-02 NOTE — Progress Notes (Addendum)
Subjective:    Patient ID: Lauren Nielsen, female    DOB: [birth year], 39 y.o.   MRN: [id number]  HPI 12/2013 note Assessment & Plan:  Health Maintenance DASH diet given. She is a non-smoker. Advised to be sure to get yearly mm due in November  Strong FH of premenopausal breast cancer. Given Number for [facility] for genetic counseling and I advised BRCA . She will call genetic counselor  HTN: Well controlled  Hyperlipidemia Will check labs today No myalgias. firther management based on results  Obesity Advised diet exercise.   Knee pain: Advised to make appt with orthopedic  Depresssion continue WEllbutrin   See me as needed       TODAY   Lauren Nielsen is here for follow up  She is frustrated as she has not had a good relationship with her GI MD at [facility].  She was told she had an ulcer and was given Dexilant and very expensive medications including Liada for diarrhea which she cannot afford  I do not have any records.  She has also seen Dr.  Collene Mares in past   Frustrated by weight.  Has not tried [employer]   Does not exercise much   Would like to try lower dose of Wellbutrin  Allergies  Allergen Reactions  . Ppd [Tuberculin Purified Protein Derivative] Anaphylaxis  . Latex    Past Medical History  Diagnosis Date  . Endometriosis   . Depression   . Anxiety   . Hyperlipidemia   . Migraines   . Asperger's disorder    Past Surgical History  Procedure Laterality Date  . Laparotomy    . Laparoscopy    . Hernia repair    . Abdominal hysterectomy     History   Social History  . Marital Status: Married    Spouse Name: N/A  . Number of Children: N/A  . Years of Education: N/A   Occupational History  . Designer, industrial/product    Social History Main Topics  . Smoking status: Former Smoker -- .5 years    Types: Cigarettes  . Smokeless tobacco: Never Used  . Alcohol Use: No  . Drug Use: Yes     Comment: marijuana in 20's  . Sexual Activity:    Partners: Female, Female   Birth Control/ Protection: Surgical   Other Topics Concern  . Not on file   Social History Narrative  . No narrative on file   Family History  Problem Relation Age of Onset  . Cancer Mother     breast,uterine,liver,melanoma,  . Cancer Father     prostate and brain  . Depression Brother   . Hypothyroidism Brother    Patient Active Problem List   Diagnosis Date Noted  . Obesity 12/27/2013  . Diarrhea 09/02/2013  . S/P hysterectomy 09/02/2013  . Sinusitis 12/20/2012  . Yeast vaginitis 10/25/2012  . History of migraine headaches 08/23/2012  . Endometriosis 06/30/2012  . Family history of breast cancer in first degree relative 06/30/2012  . CHRONIC RHINITIS 12/15/2010  . COUGH 10/22/2010  . HYPERTENSION 10/10/2010  . UNSPECIFIED SINUSITIS 10/10/2010  . DYSPNEA ON EXERTION 10/10/2010  . HYPERLIPIDEMIA 10/09/2010  . DEPRESSION 10/09/2010   Current Outpatient Prescriptions on File Prior to Visit  Medication Sig Dispense Refill  . atorvastatin (LIPITOR) 10 MG tablet Take one tablet every other day 45 tablet 1  . buPROPion (WELLBUTRIN) 100 MG tablet Take 1 tablet (100 mg total) by mouth once. 90 tablet 3  . chlorpheniramine-HYDROcodone (TUSSIONEX PENNKINETIC  ER) 10-8 MG/5ML LQCR Take 5 mLs by mouth every 12 (twelve) hours as needed for cough. 240 mL 0  . citalopram (CELEXA) 20 MG tablet TAKE ONE TABLET BY MOUTH ONE TIME DAILY  30 tablet 5  . EPINEPHrine (EPI-PEN) 0.3 mg/0.3 mL SOAJ injection Inject per instruction for severe allergic reaction 1 Device 1  . Fish Oil-Cholecalciferol (FISH OIL + D3 PO) Take 1 tablet by mouth daily.    Marland Kitchen loratadine-pseudoephedrine (CLARITIN-D 24-HOUR) 10-240 MG per 24 hr tablet Take 1 tablet by mouth daily.    Marland Kitchen oseltamivir (TAMIFLU) 75 MG capsule Take 1 capsule (75 mg total) by mouth 2 (two) times daily. Take one bid for 5 days 10 capsule 0  . topiramate (TOPAMAX) 25 MG tablet Take one daily 90 tablet 1   No current facility-administered medications  on file prior to visit.       Review of Systems    see HPI  Objective:   Physical Exam Physical Exam  Nursing note and vitals reviewed.  Constitutional: She is oriented to person, place, and time. She appears well-developed and well-nourished.  HENT:  Head: Normocephalic and atraumatic.  Cardiovascular: Normal rate and regular rhythm. Exam reveals no gallop and no friction rub.  No murmur heard.  Pulmonary/Chest: Breath sounds normal. She has no wheezes. She has no rales.  Neurological: She is alert and oriented to person, place, and time.  Skin: Skin is warm and dry.  Psychiatric: She has a normal mood and affect. Her behavior is normal.              Assessment & Plan:  ??? PUD  Will get old records    Will make appt with PA at Dr. Ferdinand Lango office for pt to be seen   Depression OK to decrease dose of Wellbutrin to 75 mg  Daily  RX given   Weight gain   Given copy of 3 day diet .  Will elevated BP's in past do not want to give RX meds.   Addendum 2/10  Received office notes  Dr. Ferdinand Lango  .  No xray or pathology results were faxed to me.   According to notes she had a 7 mm gastic antral ulcer.  I do not have any path results or CT scan results.    We have made her a follow up appt at Dr. Ferdinand Lango office and advised pt to keep this for further management .

## 2015-01-02 NOTE — Telephone Encounter (Signed)
I spoke with Lanya in regards to her office notes we received from Dr. Ferdinand Lango

## 2015-01-02 NOTE — Telephone Encounter (Signed)
Lauren Nielsen  Call pt and let her know that I received offic notes but did not get any pathology report or any xray results  In office notes she did have a 7 mm stomach ulcer and it is a good idea to make sure this has healed with a follow up endoscopy  I do not have path report of any colon biopsies but she should keep appt with the PA in Dr. Ferdinand Lango office to discuss less expensive meds and any path reports

## 2015-01-03 ENCOUNTER — Encounter: Payer: Self-pay | Admitting: Family Medicine

## 2015-01-03 ENCOUNTER — Ambulatory Visit (INDEPENDENT_AMBULATORY_CARE_PROVIDER_SITE_OTHER): Payer: 59 | Admitting: Family Medicine

## 2015-01-03 VITALS — BP 139/93 | HR 76 | Ht 62.0 in | Wt 214.0 lb

## 2015-01-03 DIAGNOSIS — M25561 Pain in right knee: Secondary | ICD-10-CM

## 2015-01-03 MED ORDER — METHYLPREDNISOLONE ACETATE 40 MG/ML IJ SUSP
40.0000 mg | Freq: Once | INTRAMUSCULAR | Status: AC
  Administered 2015-01-03: 40 mg via INTRA_ARTICULAR

## 2015-01-03 NOTE — Patient Instructions (Signed)
Your knee pain is due to a combination of patellofemoral syndrome and mild arthritis. We will obtain records from Dr. Rudene Anda office. Take tylenol 500mg  1-2 tabs three times a day for pain. Glucosamine sulfate 750mg  twice a day is a supplement that may help. Capsaicin, aspercreme, or biofreeze topically up to four times a day may also help with pain. Cortisone injections are an option - you were given one of these today. It's important that you continue to stay active. Straight leg raises, straight leg raises with foot turned outwards, and side leg raises 3 sets of 10 once a day (add ankle weight if these become too easy). Consider physical therapy to strengthen muscles around the joint that hurts to take pressure off of the joint itself. Shoe inserts with good arch support may be helpful (dr. Zoe Lan active series). Heat or ice 15 minutes at a time 3-4 times a day as needed to help with pain. Water aerobics and cycling with low resistance are the best two types of exercise for arthritis. I would recommend waiting 3-4 weeks before going ahead with a walk:jog program. Follow up with me in 6 weeks.

## 2015-01-07 DIAGNOSIS — M25561 Pain in right knee: Secondary | ICD-10-CM | POA: Insufficient documentation

## 2015-01-07 NOTE — Assessment & Plan Note (Signed)
Been told in past she has DJD.  Her history and exam also suggest patellofemoral syndrome.  Shown home exercises to do daily and encouraged better arch support.  Went ahead with intraarticular injection today as well for DJD.  ROI filled out to obtain records from ortho.  Discussed tylenol, glucosamine, topical medications.  Consider physical therapy if not improving.  F/u in 6 weeks.  After informed written consent, patient was seated on exam table. Right knee was prepped with alcohol swab and utilizing anterolateral approach, patient's right knee was injected intraarticularly with 3:1 marcaine: depomedrol. Patient tolerated the procedure well without immediate complications.

## 2015-01-07 NOTE — Progress Notes (Signed)
PCP: SCHOENHOFF,DEBBIE, MD  Subjective:   HPI: Patient is a 39 y.o. female here for right knee pain.  Patient reports for about 2 years she has had right anterior knee pain. Feels deep within the front of the knee. Has been very bad past couple months. Hurts worse to use stairs. Some pain in left knee but not as much. Has been icing, elevating, taking ibuprofen as neeeded. Has had falls in the past - pet sitting, walking, sometimes falling over the animals. No catching, locking, instability. Would like to get back to running. Told in past at ortho she has arthritis in both knees. Has had injections as well in past.  Past Medical History  Diagnosis Date  . Endometriosis   . Depression   . Anxiety   . Hyperlipidemia   . Migraines   . Asperger's disorder     Current Outpatient Prescriptions on File Prior to Visit  Medication Sig Dispense Refill  . atorvastatin (LIPITOR) 10 MG tablet Take one tablet every other day 45 tablet 1  . buPROPion (WELLBUTRIN) 75 MG tablet Take one daily 30 tablet 5  . citalopram (CELEXA) 20 MG tablet TAKE ONE TABLET BY MOUTH ONE TIME DAILY  30 tablet 5  . EPINEPHrine (EPI-PEN) 0.3 mg/0.3 mL SOAJ injection Inject per instruction for severe allergic reaction 1 Device 1  . loratadine-pseudoephedrine (CLARITIN-D 24-HOUR) 10-240 MG per 24 hr tablet Take 1 tablet by mouth daily.     No current facility-administered medications on file prior to visit.    Past Surgical History  Procedure Laterality Date  . Laparotomy    . Laparoscopy    . Hernia repair    . Abdominal hysterectomy      Allergies  Allergen Reactions  . Ppd [Tuberculin Purified Protein Derivative] Anaphylaxis  . Latex     History   Social History  . Marital Status: Married    Spouse Name: N/A  . Number of Children: N/A  . Years of Education: N/A   Occupational History  . Designer, industrial/product    Social History Main Topics  . Smoking status: Former Smoker -- .5 years    Types:  Cigarettes  . Smokeless tobacco: Never Used  . Alcohol Use: No  . Drug Use: Yes     Comment: marijuana in 20's  . Sexual Activity:    Partners: Female, Female    Birth Control/ Protection: Surgical   Other Topics Concern  . Not on file   Social History Narrative    Family History  Problem Relation Age of Onset  . Cancer Mother     breast,uterine,liver,melanoma,  . Cancer Father     prostate and brain  . Depression Brother   . Hypothyroidism Brother     BP 139/93 mmHg  Pulse 76  Ht 5\' 2"  (1.575 m)  Wt 214 lb (97.07 kg)  BMI 39.13 kg/m2  LMP 07/24/2012  Review of Systems: See HPI above.    Objective:  Physical Exam:  Gen: NAD  Pes planus, overpronation. Right knee: Mild VMO atrophy.  No bruising, effusion, other deformity. Mild medial joint line tenderness.  No other tenderness. FROM. Negative ant/post drawers. Negative valgus/varus testing. Negative lachmanns. Negative mcmurrays, apleys, patellar apprehension. Hip abduction 4+/5. NV intact distally.    Assessment & Plan:  1. Right knee pain - Been told in past she has DJD.  Her history and exam also suggest patellofemoral syndrome.  Shown home exercises to do daily and encouraged better arch support.  Went ahead  with intraarticular injection today as well for DJD.  ROI filled out to obtain records from ortho.  Discussed tylenol, glucosamine, topical medications.  Consider physical therapy if not improving.  F/u in 6 weeks.  After informed written consent, patient was seated on exam table. Right knee was prepped with alcohol swab and utilizing anterolateral approach, patient's right knee was injected intraarticularly with 3:1 marcaine: depomedrol. Patient tolerated the procedure well without immediate complications.

## 2015-01-08 ENCOUNTER — Encounter: Payer: Self-pay | Admitting: *Deleted

## 2015-02-14 ENCOUNTER — Ambulatory Visit: Payer: Self-pay | Admitting: Internal Medicine

## 2015-02-18 ENCOUNTER — Encounter: Payer: Self-pay | Admitting: Internal Medicine

## 2015-02-18 ENCOUNTER — Ambulatory Visit (INDEPENDENT_AMBULATORY_CARE_PROVIDER_SITE_OTHER): Payer: 59 | Admitting: Internal Medicine

## 2015-02-18 VITALS — BP 127/87 | HR 91 | Resp 16 | Ht 63.0 in | Wt 216.0 lb

## 2015-02-18 DIAGNOSIS — R300 Dysuria: Secondary | ICD-10-CM

## 2015-02-18 DIAGNOSIS — E785 Hyperlipidemia, unspecified: Secondary | ICD-10-CM

## 2015-02-18 LAB — CBC WITH DIFFERENTIAL/PLATELET
Basophils Absolute: 0 10*3/uL (ref 0.0–0.1)
Basophils Relative: 0 % (ref 0–1)
EOS PCT: 2 % (ref 0–5)
Eosinophils Absolute: 0.1 10*3/uL (ref 0.0–0.7)
HCT: 39.5 % (ref 36.0–46.0)
HEMOGLOBIN: 13.3 g/dL (ref 12.0–15.0)
Lymphocytes Relative: 37 % (ref 12–46)
Lymphs Abs: 2.4 10*3/uL (ref 0.7–4.0)
MCH: 30.3 pg (ref 26.0–34.0)
MCHC: 33.7 g/dL (ref 30.0–36.0)
MCV: 90 fL (ref 78.0–100.0)
MPV: 9.7 fL (ref 8.6–12.4)
Monocytes Absolute: 0.5 10*3/uL (ref 0.1–1.0)
Monocytes Relative: 8 % (ref 3–12)
NEUTROS ABS: 3.4 10*3/uL (ref 1.7–7.7)
Neutrophils Relative %: 53 % (ref 43–77)
Platelets: 314 10*3/uL (ref 150–400)
RBC: 4.39 MIL/uL (ref 3.87–5.11)
RDW: 14.4 % (ref 11.5–15.5)
WBC: 6.4 10*3/uL (ref 4.0–10.5)

## 2015-02-18 LAB — COMPREHENSIVE METABOLIC PANEL
ALT: 10 U/L (ref 0–35)
AST: 11 U/L (ref 0–37)
Albumin: 3.9 g/dL (ref 3.5–5.2)
Alkaline Phosphatase: 72 U/L (ref 39–117)
BILIRUBIN TOTAL: 0.4 mg/dL (ref 0.2–1.2)
BUN: 12 mg/dL (ref 6–23)
CO2: 24 meq/L (ref 19–32)
CREATININE: 0.69 mg/dL (ref 0.50–1.10)
Calcium: 8.7 mg/dL (ref 8.4–10.5)
Chloride: 103 mEq/L (ref 96–112)
GLUCOSE: 80 mg/dL (ref 70–99)
Potassium: 4.4 mEq/L (ref 3.5–5.3)
SODIUM: 138 meq/L (ref 135–145)
Total Protein: 6.5 g/dL (ref 6.0–8.3)

## 2015-02-18 LAB — POCT URINALYSIS DIPSTICK
Bilirubin, UA: NEGATIVE
Blood, UA: NEGATIVE
Glucose, UA: NEGATIVE
KETONES UA: NEGATIVE
LEUKOCYTES UA: NEGATIVE
Nitrite, UA: NEGATIVE
PH UA: 6.5
SPEC GRAV UA: 1.01
UROBILINOGEN UA: NEGATIVE

## 2015-02-18 MED ORDER — FLUCONAZOLE 150 MG PO TABS: 150.0000 mg | ORAL_TABLET | Freq: Once | ORAL | Status: DC

## 2015-02-18 MED ORDER — CITALOPRAM HYDROBROMIDE 20 MG PO TABS: 20.0000 mg | ORAL_TABLET | Freq: Every day | ORAL | Status: DC

## 2015-02-18 NOTE — Progress Notes (Signed)
Subjective:    Patient ID: Lauren Nielsen, female    DOB: 10/21/1976, 39 y.o.   MRN: [id number]  HPI  [facility] Given 3 day course of cipro.  Symptoms improved  Hyperlipidemia  On statin  Allergies  Allergen Reactions  . Ppd [Tuberculin Purified Protein Derivative] Anaphylaxis  . Latex    Past Medical History  Diagnosis Date  . Endometriosis   . Depression   . Anxiety   . Hyperlipidemia   . Migraines   . Asperger's disorder    Past Surgical History  Procedure Laterality Date  . Laparotomy    . Laparoscopy    . Hernia repair    . Abdominal hysterectomy     History   Social History  . Marital Status: Married    Spouse Name: N/A  . Number of Children: N/A  . Years of Education: N/A   Occupational History  . Designer, industrial/product    Social History Main Topics  . Smoking status: Former Smoker -- .5 years    Types: Cigarettes  . Smokeless tobacco: Never Used  . Alcohol Use: No  . Drug Use: Yes     Comment: marijuana in 20's  . Sexual Activity:    Partners: Female, Female    Birth Control/ Protection: Surgical   Other Topics Concern  . Not on file   Social History Narrative   Family History  Problem Relation Age of Onset  . Cancer Mother     breast,uterine,liver,melanoma,  . Cancer Father     prostate and brain  . Depression Brother   . Hypothyroidism Brother    Patient Active Problem List   Diagnosis Date Noted  . Right knee pain 01/07/2015  . Obesity 12/27/2013  . Diarrhea 09/02/2013  . S/P hysterectomy 09/02/2013  . Sinusitis 12/20/2012  . Yeast vaginitis 10/25/2012  . History of migraine headaches 08/23/2012  . Endometriosis 06/30/2012  . Family history of breast cancer in first degree relative 06/30/2012  . CHRONIC RHINITIS 12/15/2010  . COUGH 10/22/2010  . HYPERTENSION 10/10/2010  . UNSPECIFIED SINUSITIS 10/10/2010  . DYSPNEA ON EXERTION 10/10/2010  . HYPERLIPIDEMIA 10/09/2010  . DEPRESSION 10/09/2010   Current  Outpatient Prescriptions on File Prior to Visit  Medication Sig Dispense Refill  . atorvastatin (LIPITOR) 10 MG tablet Take one tablet every other day 45 tablet 1  . buPROPion (WELLBUTRIN) 75 MG tablet Take one daily 30 tablet 5  . EPINEPHrine (EPI-PEN) 0.3 mg/0.3 mL SOAJ injection Inject per instruction for severe allergic reaction 1 Device 1  . loratadine-pseudoephedrine (CLARITIN-D 24-HOUR) 10-240 MG per 24 hr tablet Take 1 tablet by mouth daily.     No current facility-administered medications on file prior to visit.      Review of Systems    see HPI Objective:   Physical Exam Physical Exam  Nursing note and vitals reviewed.  Constitutional: She is oriented to person, place, and time. She appears well-developed and well-nourished.  HENT:  Head: Normocephalic and atraumatic.  Cardiovascular: Normal rate and regular rhythm. Exam reveals no gallop and no friction rub.  No murmur heard.  Pulmonary/Chest: Breath sounds normal. She has no wheezes. She has no rales.  ABD no CVA or suprapubic tnederness Neurological: She is alert and oriented to person, place, and time.  Skin: Skin is warm and dry.  Psychiatric: She has a normal mood and affect. Her behavior is normal.  Assessment & Plan:  UTI  U/A today normal   Complete cipro  Advised [facility] regarding culture results.  Pt has their number   Hyperlipidemia  Will check chemistries today  Pt is not fasting

## 2015-02-19 ENCOUNTER — Telehealth: Payer: Self-pay | Admitting: *Deleted

## 2015-02-19 NOTE — Telephone Encounter (Signed)
I spoke with patient about her lab results

## 2015-02-19 NOTE — Telephone Encounter (Signed)
-----   Message from Lanice Shirts, MD sent at 02/19/2015  7:33 AM EDT ----- Call pt and let her know that her WBC is normal  Labs look good  Ok to mail to pt

## 2015-03-18 ENCOUNTER — Other Ambulatory Visit: Payer: Self-pay | Admitting: *Deleted

## 2015-03-18 MED ORDER — CITALOPRAM HYDROBROMIDE 20 MG PO TABS: 20.0000 mg | ORAL_TABLET | Freq: Every day | ORAL | Status: DC

## 2015-03-18 MED ORDER — BUPROPION HCL 75 MG PO TABS: ORAL_TABLET | ORAL | Status: DC

## 2015-03-18 MED ORDER — ATORVASTATIN CALCIUM 10 MG PO TABS: ORAL_TABLET | ORAL | Status: DC

## 2015-03-18 NOTE — Telephone Encounter (Signed)
Refill request

## 2015-04-24 ENCOUNTER — Encounter: Payer: Self-pay | Admitting: Internal Medicine

## 2015-07-08 ENCOUNTER — Other Ambulatory Visit: Payer: Self-pay

## 2015-07-08 DIAGNOSIS — Z1231 Encounter for screening mammogram for malignant neoplasm of breast: Secondary | ICD-10-CM

## 2015-07-08 DIAGNOSIS — Z803 Family history of malignant neoplasm of breast: Secondary | ICD-10-CM

## 2015-08-16 ENCOUNTER — Ambulatory Visit: Payer: 59

## 2015-09-26 ENCOUNTER — Ambulatory Visit: Payer: 59

## 2015-10-11 ENCOUNTER — Ambulatory Visit
Admission: RE | Admit: 2015-10-11 | Discharge: 2015-10-11 | Disposition: A | Discharge status: Home / Self Care | Payer: 59 | Source: Ambulatory Visit

## 2015-10-11 DIAGNOSIS — Z1231 Encounter for screening mammogram for malignant neoplasm of breast: Secondary | ICD-10-CM

## 2015-10-11 DIAGNOSIS — Z803 Family history of malignant neoplasm of breast: Secondary | ICD-10-CM

## 2016-07-28 ENCOUNTER — Observation Stay (HOSPITAL_BASED_OUTPATIENT_CLINIC_OR_DEPARTMENT_OTHER)
Admission: EM | Admit: 2016-07-28 | Discharge: 2016-07-29 | Disposition: A | Discharge status: Home / Self Care | Payer: Commercial Managed Care - HMO | Attending: Internal Medicine | Admitting: Internal Medicine

## 2016-07-28 ENCOUNTER — Inpatient Hospital Stay (HOSPITAL_COMMUNITY): Payer: Commercial Managed Care - HMO

## 2016-07-28 ENCOUNTER — Encounter (HOSPITAL_BASED_OUTPATIENT_CLINIC_OR_DEPARTMENT_OTHER): Payer: Self-pay

## 2016-07-28 DIAGNOSIS — E876 Hypokalemia: Secondary | ICD-10-CM | POA: Insufficient documentation

## 2016-07-28 DIAGNOSIS — F329 Major depressive disorder, single episode, unspecified: Secondary | ICD-10-CM

## 2016-07-28 DIAGNOSIS — E785 Hyperlipidemia, unspecified: Secondary | ICD-10-CM | POA: Insufficient documentation

## 2016-07-28 DIAGNOSIS — L03113 Cellulitis of right upper limb: Principal | ICD-10-CM

## 2016-07-28 DIAGNOSIS — W5501XD Bitten by cat, subsequent encounter: Secondary | ICD-10-CM

## 2016-07-28 DIAGNOSIS — Z79899 Other long term (current) drug therapy: Secondary | ICD-10-CM | POA: Diagnosis not present

## 2016-07-28 DIAGNOSIS — S41151A Open bite of right upper arm, initial encounter: Secondary | ICD-10-CM | POA: Insufficient documentation

## 2016-07-28 DIAGNOSIS — L0390 Cellulitis, unspecified: Secondary | ICD-10-CM

## 2016-07-28 DIAGNOSIS — W5501XA Bitten by cat, initial encounter: Secondary | ICD-10-CM | POA: Insufficient documentation

## 2016-07-28 DIAGNOSIS — T148XXA Other injury of unspecified body region, initial encounter: Secondary | ICD-10-CM

## 2016-07-28 DIAGNOSIS — Z87891 Personal history of nicotine dependence: Secondary | ICD-10-CM | POA: Diagnosis not present

## 2016-07-28 DIAGNOSIS — I10 Essential (primary) hypertension: Secondary | ICD-10-CM | POA: Insufficient documentation

## 2016-07-28 HISTORY — DX: Nausea with vomiting, unspecified: R11.2

## 2016-07-28 HISTORY — DX: Other specified postprocedural states: Z98.890

## 2016-07-28 LAB — CBC WITH DIFFERENTIAL/PLATELET
BASOS ABS: 0 10*3/uL (ref 0.0–0.1)
Basophils Relative: 0 %
EOS ABS: 0.1 10*3/uL (ref 0.0–0.7)
Eosinophils Relative: 2 %
HCT: 37.6 % (ref 36.0–46.0)
HEMOGLOBIN: 13.4 g/dL (ref 12.0–15.0)
Lymphocytes Relative: 25 %
Lymphs Abs: 2.2 10*3/uL (ref 0.7–4.0)
MCH: 31.8 pg (ref 26.0–34.0)
MCHC: 35.6 g/dL (ref 30.0–36.0)
MCV: 89.3 fL (ref 78.0–100.0)
Monocytes Absolute: 0.8 10*3/uL (ref 0.1–1.0)
Monocytes Relative: 9 %
NEUTROS PCT: 64 %
Neutro Abs: 5.8 10*3/uL (ref 1.7–7.7)
Platelets: 258 10*3/uL (ref 150–400)
RBC: 4.21 MIL/uL (ref 3.87–5.11)
RDW: 12.1 % (ref 11.5–15.5)
WBC: 9 10*3/uL (ref 4.0–10.5)

## 2016-07-28 LAB — CBC
HCT: 35.8 % — ABNORMAL LOW (ref 36.0–46.0)
Hemoglobin: 13.1 g/dL (ref 12.0–15.0)
MCH: 32.1 pg (ref 26.0–34.0)
MCHC: 36.6 g/dL — ABNORMAL HIGH (ref 30.0–36.0)
MCV: 87.7 fL (ref 78.0–100.0)
Platelets: 219 10*3/uL (ref 150–400)
RBC: 4.08 MIL/uL (ref 3.87–5.11)
RDW: 12.3 % (ref 11.5–15.5)
WBC: 8.3 10*3/uL (ref 4.0–10.5)

## 2016-07-28 LAB — LACTIC ACID, PLASMA: Lactic Acid, Venous: 1.7 mmol/L (ref 0.5–1.9)

## 2016-07-28 LAB — BASIC METABOLIC PANEL
ANION GAP: 7 (ref 5–15)
BUN: 13 mg/dL (ref 6–20)
CALCIUM: 8.7 mg/dL — AB (ref 8.9–10.3)
CO2: 26 mmol/L (ref 22–32)
Chloride: 100 mmol/L — ABNORMAL LOW (ref 101–111)
Creatinine, Ser: 0.57 mg/dL (ref 0.44–1.00)
GLUCOSE: 97 mg/dL (ref 65–99)
Potassium: 3.7 mmol/L (ref 3.5–5.1)
SODIUM: 133 mmol/L — AB (ref 135–145)

## 2016-07-28 LAB — CREATININE, SERUM
CREATININE: 0.83 mg/dL (ref 0.44–1.00)
GFR calc Af Amer: >60 mL/min (ref >60)

## 2016-07-28 MED ORDER — HYDROCODONE-ACETAMINOPHEN 5-325 MG PO TABS: 1.0000 | ORAL_TABLET | Freq: Two times a day (BID) | ORAL | Status: DC | PRN

## 2016-07-28 MED ORDER — ASPIRIN-ACETAMINOPHEN-CAFFEINE 250-250-65 MG PO TABS
2.0000 | ORAL_TABLET | Freq: Every day | ORAL | Status: DC | PRN
  Filled 2016-07-28: qty 2

## 2016-07-28 MED ORDER — ONDANSETRON HCL 4 MG/2ML IJ SOLN
4.0000 mg | Freq: Once | INTRAMUSCULAR | Status: AC
  Administered 2016-07-28: 4 mg via INTRAVENOUS
  Filled 2016-07-28: qty 2

## 2016-07-28 MED ORDER — RISAQUAD PO CAPS
1.0000 | ORAL_CAPSULE | Freq: Every day | ORAL | Status: DC
  Administered 2016-07-28 – 2016-07-29 (×2): 1 via ORAL
  Filled 2016-07-28 (×2): qty 1

## 2016-07-28 MED ORDER — ACETAMINOPHEN 325 MG PO TABS
650.0000 mg | ORAL_TABLET | Freq: Four times a day (QID) | ORAL | Status: DC | PRN
  Administered 2016-07-28: 650 mg via ORAL
  Filled 2016-07-28: qty 2

## 2016-07-28 MED ORDER — MORPHINE SULFATE (PF) 4 MG/ML IV SOLN
4.0000 mg | Freq: Once | INTRAVENOUS | Status: AC
  Administered 2016-07-28: 4 mg via INTRAVENOUS
  Filled 2016-07-28: qty 1

## 2016-07-28 MED ORDER — FAMOTIDINE 20 MG PO TABS
20.0000 mg | ORAL_TABLET | Freq: Every day | ORAL | Status: DC
  Administered 2016-07-29: 20 mg via ORAL
  Filled 2016-07-28 (×2): qty 1

## 2016-07-28 MED ORDER — IBUPROFEN 200 MG PO TABS: 400.0000 mg | ORAL_TABLET | Freq: Four times a day (QID) | ORAL | Status: DC | PRN

## 2016-07-28 MED ORDER — BUPROPION HCL 75 MG PO TABS
75.0000 mg | ORAL_TABLET | Freq: Every day | ORAL | Status: DC
  Administered 2016-07-28: 75 mg via ORAL
  Filled 2016-07-28 (×2): qty 1

## 2016-07-28 MED ORDER — ACETAMINOPHEN 650 MG RE SUPP: 650.0000 mg | Freq: Four times a day (QID) | RECTAL | Status: DC | PRN

## 2016-07-28 MED ORDER — ONDANSETRON HCL 4 MG/2ML IJ SOLN: 4.0000 mg | Freq: Four times a day (QID) | INTRAMUSCULAR | Status: DC | PRN

## 2016-07-28 MED ORDER — PIPERACILLIN-TAZOBACTAM 3.375 G IVPB
3.3750 g | Freq: Three times a day (TID) | INTRAVENOUS | Status: DC
  Administered 2016-07-29 (×2): 3.375 g via INTRAVENOUS
  Filled 2016-07-28 (×2): qty 50

## 2016-07-28 MED ORDER — LORATADINE 10 MG PO TABS
10.0000 mg | ORAL_TABLET | Freq: Every day | ORAL | Status: DC
  Administered 2016-07-28: 10 mg via ORAL
  Filled 2016-07-28: qty 1

## 2016-07-28 MED ORDER — ENOXAPARIN SODIUM 60 MG/0.6ML ~~LOC~~ SOLN
50.0000 mg | SUBCUTANEOUS | Status: DC
  Filled 2016-07-28: qty 0.6

## 2016-07-28 MED ORDER — SODIUM CHLORIDE 0.9 % IV SOLN: 250.0000 mL | INTRAVENOUS | Status: DC | PRN

## 2016-07-28 MED ORDER — VANCOMYCIN HCL IN DEXTROSE 1-5 GM/200ML-% IV SOLN
1000.0000 mg | Freq: Once | INTRAVENOUS | Status: AC
Start: 2016-07-28 — End: 2016-07-28
  Administered 2016-07-28: 1000 mg via INTRAVENOUS
  Filled 2016-07-28: qty 200

## 2016-07-28 MED ORDER — VANCOMYCIN HCL IN DEXTROSE 1-5 GM/200ML-% IV SOLN
1000.0000 mg | Freq: Three times a day (TID) | INTRAVENOUS | Status: DC
  Administered 2016-07-29 (×2): 1000 mg via INTRAVENOUS
  Filled 2016-07-28 (×2): qty 200

## 2016-07-28 MED ORDER — ONDANSETRON HCL 4 MG PO TABS
4.0000 mg | ORAL_TABLET | Freq: Four times a day (QID) | ORAL | Status: DC | PRN
  Administered 2016-07-28: 4 mg via ORAL
  Filled 2016-07-28: qty 1

## 2016-07-28 MED ORDER — POTASSIUM CHLORIDE CRYS ER 20 MEQ PO TBCR
20.0000 meq | EXTENDED_RELEASE_TABLET | Freq: Once | ORAL | Status: AC
  Administered 2016-07-28: 20 meq via ORAL
  Filled 2016-07-28: qty 1

## 2016-07-28 MED ORDER — DIPHENHYDRAMINE HCL 25 MG PO CAPS
25.0000 mg | ORAL_CAPSULE | Freq: Every evening | ORAL | Status: DC | PRN
  Administered 2016-07-28: 25 mg via ORAL
  Filled 2016-07-28 (×2): qty 1

## 2016-07-28 MED ORDER — SODIUM CHLORIDE 0.9% FLUSH: 3.0000 mL | INTRAVENOUS | Status: DC | PRN

## 2016-07-28 MED ORDER — LOPERAMIDE HCL 2 MG PO CAPS: 2.0000 mg | ORAL_CAPSULE | Freq: Every day | ORAL | Status: DC | PRN

## 2016-07-28 MED ORDER — SODIUM CHLORIDE 0.9 % IV SOLN
3.0000 g | Freq: Once | INTRAVENOUS | Status: AC
  Administered 2016-07-28: 3 g via INTRAVENOUS
  Filled 2016-07-28: qty 3

## 2016-07-28 MED ORDER — PIPERACILLIN-TAZOBACTAM 3.375 G IVPB 30 MIN
3.3750 g | Freq: Once | INTRAVENOUS | Status: AC
Start: 2016-07-28 — End: 2016-07-28
  Administered 2016-07-28: 3.375 g via INTRAVENOUS
  Filled 2016-07-28: qty 50

## 2016-07-28 MED ORDER — CITALOPRAM HYDROBROMIDE 20 MG PO TABS
20.0000 mg | ORAL_TABLET | Freq: Every day | ORAL | Status: DC
  Administered 2016-07-28: 20 mg via ORAL
  Filled 2016-07-28: qty 1

## 2016-07-28 MED ORDER — SODIUM CHLORIDE 0.9% FLUSH
3.0000 mL | Freq: Two times a day (BID) | INTRAVENOUS | Status: DC
  Administered 2016-07-28: 3 mL via INTRAVENOUS

## 2016-07-28 NOTE — ED Notes (Signed)
Report given to [place] at [facility].

## 2016-07-28 NOTE — ED Triage Notes (Signed)
Cat bite to right UE Sunday-seen at urgent care day of-given abx-pt states area is worse with pain to entire right arm-redness is outside drawn line-NAD-steady gait

## 2016-07-28 NOTE — H&P (Signed)
History and Physical    Ladora N Harvie [id number] DOB: 1976-10-13 DOA: 07/28/2016  PCP: Kelton Pillar, MD   Patient coming from: Home  Chief Complaint: Right upper extremity rash  HPI: Lauren Nielsen is a 40 y.o. female with medical history significant of depression who presents due to the hospital with worsening rash on her right upper extremity. 2 days ago she was bitten by her cat in the inner part of her proximal humerus. She developed erythema, edema and tenderness. She was seen by the local emergency department were she was diagnosed with cellulitis and placed on Augmentin. Over the next 48 hours her rash has worsened, is larger and more painful. The pain is moderate to severe intensity, localized in the region of the rash, there is no improving or worsening factors, no other associated symptoms. Denies any fevers or chills. No loss of appetite.  ED Course:  IV Unasyn  Review of Systems: 1. General. No fevers or chills 2. ENT no runny nose or sore throat 3. Cardiovascular no angina or claudication or syncope 4. Pulmonary no shortness of breath cough or hemoptysis 5. Cardiovascular no nausea vomiting or diarrhea 7. Musculoskeletal no joint pain 8. Dermatology positive for rash in the right upper extremity as mentioned in history present illness 9. Hematology no easy bruisability or frequent infections 10. Neurology no seizures or paresthesias   Past Medical History:  Diagnosis Date  . Anxiety   . Asperger's disorder   . Depression   . Endometriosis   . Hyperlipidemia   . Migraines   . PONV (postoperative nausea and vomiting)     Past Surgical History:  Procedure Laterality Date  . ABDOMINAL HYSTERECTOMY    . HERNIA REPAIR    . LAPAROSCOPY    . LAPAROTOMY       reports that she has quit smoking. She quit after 0.50 years of use. She has never used smokeless tobacco. She reports that she uses drugs. She reports that she does not drink alcohol.  Allergies    Allergen Reactions  . Ppd [Tuberculin Purified Protein Derivative] Anaphylaxis  . Latex Itching and Rash    Family History  Problem Relation Age of Onset  . Cancer Mother     breast,uterine,liver,melanoma,  . Cancer Father     prostate and brain  . Depression Brother   . Hypothyroidism Brother      Prior to Admission medications   Medication Sig Start Date End Date Taking? Authorizing Provider  amoxicillin-clavulanate (AUGMENTIN) 875-125 MG tablet Take 1 tablet by mouth 2 (two) times daily. 07/26/16  Yes Historical Provider, MD  aspirin-acetaminophen-caffeine (EXCEDRIN MIGRAINE) [phone] MG tablet Take 2 tablets by mouth daily as needed for headache or migraine.   Yes Historical Provider, MD  buPROPion [facility]) 75 MG tablet Take one daily 03/18/15  Yes Lanice Shirts, MD  citalopram (CELEXA) 20 MG tablet Take 1 tablet (20 mg total) by mouth daily. 03/18/15  Yes Lanice Shirts, MD  diphenhydrAMINE (BENADRYL) 25 MG tablet Take 25 mg by mouth at bedtime as needed for allergies.   Yes Historical Provider, MD  EPINEPHrine (EPI-PEN) 0.3 mg/0.3 mL SOAJ injection Inject per instruction for severe allergic reaction 10/04/13  Yes Lanice Shirts, MD  HYDROcodone-acetaminophen (NORCO/VICODIN) 5-325 MG tablet Take 1 tablet by mouth 2 (two) times daily as needed for moderate pain.  07/26/16  Yes Historical Provider, MD  ibuprofen (ADVIL,MOTRIN) 200 MG tablet Take 400 mg by mouth every 6 (six) hours as needed for headache  or moderate pain.   Yes Historical Provider, MD  loperamide (IMODIUM A-D) 2 MG tablet Take 2-4 mg by mouth daily as needed for diarrhea or loose stools.   Yes Historical Provider, MD  loratadine (CLARITIN) 10 MG tablet Take 10 mg by mouth at bedtime.   Yes Historical Provider, MD  Probiotic Product (ALIGN) 4 MG CAPS Take 4 mg by mouth daily.   Yes Historical Provider, MD  ranitidine (ZANTAC) 150 MG tablet Take 150 mg by mouth daily as needed for heartburn.   Yes  Historical Provider, MD  atorvastatin (LIPITOR) 10 MG tablet Take one tablet every other day Patient not taking: Reported on 07/28/2016 03/18/15   Lanice Shirts, MD    Physical Exam: Vitals:   07/28/16 1142 07/28/16 1346 07/28/16 1544  BP: (!) 142/108 128/94 (!) 138/98  Pulse: 88 95 80  Resp: 20 18 18   Temp: 99.1 F (37.3 C)  98.2 F (36.8 C)  TempSrc: Oral  Oral  SpO2: 99% 96% 98%  Weight:   98.1 kg (216 lb 4.3 oz)  Height:   5\' 3"  (1.6 m)      Constitutional:  Noted to be in pain Vitals:   07/28/16 1142 07/28/16 1346 07/28/16 1544  BP: (!) 142/108 128/94 (!) 138/98  Pulse: 88 95 80  Resp: 20 18 18   Temp: 99.1 F (37.3 C)  98.2 F (36.8 C)  TempSrc: Oral  Oral  SpO2: 99% 96% 98%  Weight:   98.1 kg (216 lb 4.3 oz)  Height:   5\' 3"  (1.6 m)   Eyes: PERRL, lids and conjunctivae normal Nose and ears no deformities ENMT: Mucous membranes are moist. Posterior pharynx clear of any exudate or lesions.Normal dentition.  Neck: normal, supple, no masses, no thyromegaly Respiratory: clear to auscultation bilaterally, no wheezing, no crackles. Normal respiratory effort. No accessory muscle use.  Cardiovascular: Regular rate and rhythm, no murmurs / rubs / gallops. No extremity edema. 2+ pedal pulses. No carotid bruits.  Abdomen: no tenderness, no masses palpated. No hepatosplenomegaly. Bowel sounds positive.  Musculoskeletal: no clubbing / cyanosis. No joint deformity upper and lower extremities. Good ROM, no contractures. Normal muscle tone.  Skin: Right upper extremity proximal in the region with the oval type lesion about 5 cm length and 4 with, with well-defined margins, erythematous with induration, tender to palpation, increased local temperature. Bite signs noted. Possible ipsilateral lymphadenopathy. Neurologic: CN 2-12 grossly intact. Sensation intact, DTR normal. Strength 5/5 in all 4.     Labs on Admission: I have personally reviewed following labs and imaging  studies  CBC:  Recent Labs Lab 07/28/16 1225  WBC 9.0  NEUTROABS 5.8  HGB 13.4  HCT 37.6  MCV 89.3  PLT 0000000   Basic Metabolic Panel:  Recent Labs Lab 07/28/16 1225  NA 133*  K 3.7  CL 100*  CO2 26  GLUCOSE 97  BUN 13  CREATININE 0.57  CALCIUM 8.7*   GFR: Estimated Creatinine Clearance: 104.3 mL/min (by C-G formula based on SCr of 0.8 mg/dL). Liver Function Tests: No results for input(s): AST, ALT, ALKPHOS, BILITOT, PROT, ALBUMIN in the last 168 hours. No results for input(s): LIPASE, AMYLASE in the last 168 hours. No results for input(s): AMMONIA in the last 168 hours. Coagulation Profile: No results for input(s): INR, PROTIME in the last 168 hours. Cardiac Enzymes: No results for input(s): CKTOTAL, CKMB, CKMBINDEX, TROPONINI in the last 168 hours. BNP (last 3 results) No results for input(s): PROBNP in the last 8760  hours. HbA1C: No results for input(s): HGBA1C in the last 72 hours. CBG: No results for input(s): GLUCAP in the last 168 hours. Lipid Profile: No results for input(s): CHOL, HDL, LDLCALC, TRIG, CHOLHDL, LDLDIRECT in the last 72 hours. Thyroid Function Tests: No results for input(s): TSH, T4TOTAL, FREET4, T3FREE, THYROIDAB in the last 72 hours. Anemia Panel: No results for input(s): VITAMINB12, FOLATE, FERRITIN, TIBC, IRON, RETICCTPCT in the last 72 hours. Urine analysis:    Component Value Date/Time   BILIRUBINUR neg 02/18/2015 1056   PROTEINUR ne 02/18/2015 1056   UROBILINOGEN negative 02/18/2015 1056   NITRITE neg 02/18/2015 1056   LEUKOCYTESUR Negative 02/18/2015 1056   Sepsis Labs: !!!!!!!!!!!!!!!!!!!!!!!!!!!!!!!!!!!!!!!!!!!! @LABRCNTIP (procalcitonin:4,lacticidven:4) )No results found for this or any previous visit (from the past 240 hour(s)).   Radiological Exams on Admission: No results found.  Assessment/Plan Active Problems:   Cellulitis  This is a 40 year old female with a known significant medical history who presents with a  right upper extremity cellulitis provoked by a cat bite. The rash has been worsening despite appropriate antibiotic therapy.  On the physical examination she is afebrile, blood pressure 130/98, heart rate 80, respiratory 18, oxygen saturation 98%. She does have a well demarcated rash in the proximal right humerus, which is indurated, no purulence. Her sodium 133, potassium 3.7, creatinine 0.57, BUN 13, glucose 97, white count 9.0, hemoglobin 13.4, hematocrit 37.6, platelet count 258.   Working diagnosis right upper extremity cellulitis, failed outpatient therapy  1. Cellulitis. Patient will be placed on broad-spectrum anabiotic including vancomycin and Zosyn, with follow-up on clinical response, cell count, temperature curve and cultures. Will order ultrasonography of the lesion to assess its depth. Pain control with morphine.   2. Depression. Continue Wellbutrin, citalopram.  3. Hypokalemia. Replete potassium with potassium chloride.  Patient continue high risk of developing worsening cellulitis.  DVT prophylaxis: loevenox  Code Status: Full  Family Communication: I spoke with patient's family at bedside and all questions were addressed  \Disposition Plan: Home Consults called: none  Admission status: Inpatient  Rakia Frayne Gerome Apley MD [facility] Pager [phone]  If 7PM-7AM, please contact night-coverage [email or web address] Password [facility]  07/28/2016, 6:27 PM

## 2016-07-28 NOTE — ED Provider Notes (Signed)
[facility] DEPT [facility] Provider Note   CSN: [id number] Arrival date & time: 07/28/16  1132     History   Chief Complaint Chief Complaint  Patient presents with  . Animal Bite    HPI Lauren Nielsen is a 40 y.o. female.  After injury patient was seen at urgent care and given it a shot and started on Augmentin which she has taken twice a day since the bite occurred. However the redness is spreading.   The history is provided by the patient.  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 Provoked: provoked   Notifications:  Animal control Animal's rabies vaccination status:  Up to date Animal in possession: yes   Tetanus status:  Up to date Relieved by:  Nothing Worsened by:  Activity Ineffective treatments:  OTC medications (Antibiotics) Associated symptoms: fever and swelling   Associated symptoms comment:  Spreading redness   Past Medical History:  Diagnosis Date  . Anxiety   . Asperger's disorder   . Depression   . Endometriosis   . Hyperlipidemia   . Migraines     Patient Active Problem List   Diagnosis Date Noted  . Right knee pain 01/07/2015  . Obesity 12/27/2013  . Diarrhea 09/02/2013  . S/P hysterectomy 09/02/2013  . Sinusitis 12/20/2012  . Yeast vaginitis 10/25/2012  . History of migraine headaches 08/23/2012  . Endometriosis 06/30/2012  . Family history of breast cancer in first degree relative 06/30/2012  . CHRONIC RHINITIS 12/15/2010  . COUGH 10/22/2010  . HYPERTENSION 10/10/2010  . UNSPECIFIED SINUSITIS 10/10/2010  . DYSPNEA ON EXERTION 10/10/2010  . HYPERLIPIDEMIA 10/09/2010  . DEPRESSION 10/09/2010    Past Surgical  History:  Procedure Laterality Date  . ABDOMINAL HYSTERECTOMY    . HERNIA REPAIR    . LAPAROSCOPY    . LAPAROTOMY      OB History    Gravida Para Term Preterm AB Living   0 0 0 0 0 0   SAB TAB Ectopic Multiple Live Births   0 0 0 0         Home Medications    Prior to Admission medications   Medication Sig Start Date End Date Taking? Authorizing Provider  atorvastatin (LIPITOR) 10 MG tablet Take one tablet every other day 03/18/15   Lanice Shirts, MD  buPROPion [facility]) 75 MG tablet Take one daily 03/18/15   Lanice Shirts, MD  citalopram (CELEXA) 20 MG tablet Take 1 tablet (20 mg total) by mouth daily. 03/18/15   Lanice Shirts, MD  EPINEPHrine (EPI-PEN) 0.3 mg/0.3 mL SOAJ injection Inject per instruction for severe allergic reaction 10/04/13   Lanice Shirts, MD  loratadine-pseudoephedrine (CLARITIN-D 24-HOUR) 10-240 MG per 24 hr tablet Take 1 tablet by mouth daily.    Historical Provider, MD    Family History Family History  Problem Relation Age of Onset  . Cancer Mother     breast,uterine,liver,melanoma,  . Cancer Father     prostate and brain  . Depression Brother   . Hypothyroidism Brother     Social History Social History  Substance Use Topics  . Smoking status: Former Smoker  Years: 0.50  . Smokeless tobacco: Never Used  . Alcohol use No     Allergies   Ppd [tuberculin purified protein derivative] and Latex   Review of Systems Review of Systems  Constitutional: Positive for fever.  All other systems reviewed and are negative.    Physical Exam Updated Vital Signs BP (!) 142/108 (BP Location: Left Arm)   Pulse 88   Temp 99.1 F (37.3 C) (Oral)   Resp 20   LMP 07/24/2012   SpO2 99%   Physical Exam  Constitutional: She is oriented to person, place, and time. She appears well-developed and well-nourished. No distress.  HENT:  Head: Normocephalic and atraumatic.  Mouth/Throat: Oropharynx is clear and moist.    Eyes: Conjunctivae and EOM are normal. Pupils are equal, round, and reactive to light.  Neck: Normal range of motion. Neck supple.  Cardiovascular: Normal rate, regular rhythm and intact distal pulses.   No murmur heard. Pulmonary/Chest: Effort normal and breath sounds normal. No respiratory distress. She has no wheezes. She has no rales.  Abdominal: Soft. She exhibits no distension. There is no tenderness. There is no rebound and no guarding.  Musculoskeletal: Normal range of motion. She exhibits tenderness. She exhibits no edema.       Arms: Neurological: She is alert and oriented to person, place, and time.  Skin: Skin is warm and dry. No rash noted. No erythema.  Psychiatric: She has a normal mood and affect. Her behavior is normal.  Nursing note and vitals reviewed.    ED Treatments / Results  Labs (all labs ordered are listed, but only abnormal results are displayed) Labs Reviewed  BASIC METABOLIC PANEL - Abnormal; Notable for the following:       Result Value   Sodium 133 (*)    Chloride 100 (*)    Calcium 8.7 (*)    All other components within normal limits  CBC WITH DIFFERENTIAL/PLATELET    EKG  EKG Interpretation None       Radiology No results found.  Procedures Procedures (including critical care time)  Medications Ordered in ED Medications  Ampicillin-Sulbactam (UNASYN) 3 g in sodium chloride 0.9 % 100 mL IVPB (3 g Intravenous New Bag/Given 07/28/16 1241)  morphine 4 MG/ML injection 4 mg (4 mg Intravenous Given 07/28/16 1239)  ondansetron (ZOFRAN) injection 4 mg (4 mg Intravenous Given 07/28/16 1237)     Initial Impression / Assessment and Plan / ED Course  I have reviewed the triage vital signs and the nursing notes.  Pertinent labs & imaging results that were available during my care of the patient were reviewed by me and considered in my medical decision making (see chart for details).  Clinical Course   Pt with cat bite 4 days ago currently on  augmentin wit worsening cellulitis.  Low grade fever but no other systemic sx.  Tetanus utd. Will admit and start unasyn.  Labs wnl and pt is not diabetic  Final Clinical Impressions(s) / ED Diagnoses   Final diagnoses:  Cat bite, subsequent encounter  Cellulitis of right upper extremity    New Prescriptions New Prescriptions   No medications on file     Blanchie Dessert, MD 07/28/16 1333

## 2016-07-28 NOTE — Progress Notes (Signed)
Pharmacy Antibiotic Note  Lauren Nielsen is a 40 y.o. female s/p cat bite on 9/3 and was given augmentin at Urgent Care, presented to the ED on 9/5 for worsening of wound.  To start broad abx with vancomycin and zosyn for wound infection.  Plan: - vancomycin 1gm IV x1 per MD, then 1gm IV q8h (goal trough 10-15) - zosyn 3.375 gm IV x1 at 30 min, then 3.375 gm IV q8h (over 4 hours)  _____________________________  Height: 5\' 3"  (160 cm) Weight: 216 lb 4.3 oz (98.1 kg) IBW/kg (Calculated) : 52.4  Temp (24hrs), Avg:98.7 F (37.1 C), Min:98.2 F (36.8 C), Max:99.1 F (37.3 C)   Recent Labs Lab 07/28/16 1225  WBC 9.0  CREATININE 0.57    Estimated Creatinine Clearance: 104.3 mL/min (by C-G formula based on SCr of 0.8 mg/dL).    Allergies  Allergen Reactions  . Ppd [Tuberculin Purified Protein Derivative] Anaphylaxis  . Latex Itching and Rash     Thank you for allowing pharmacy to be a part of this patient's care.  Lynelle Doctor 07/28/2016 6:02 PM

## 2016-07-29 DIAGNOSIS — S41151D Open bite of right upper arm, subsequent encounter: Secondary | ICD-10-CM | POA: Diagnosis not present

## 2016-07-29 DIAGNOSIS — W5501XD Bitten by cat, subsequent encounter: Secondary | ICD-10-CM

## 2016-07-29 DIAGNOSIS — W5501XA Bitten by cat, initial encounter: Secondary | ICD-10-CM

## 2016-07-29 DIAGNOSIS — L03113 Cellulitis of right upper limb: Secondary | ICD-10-CM | POA: Diagnosis present

## 2016-07-29 DIAGNOSIS — T148XXA Other injury of unspecified body region, initial encounter: Secondary | ICD-10-CM

## 2016-07-29 DIAGNOSIS — S41151A Open bite of right upper arm, initial encounter: Secondary | ICD-10-CM | POA: Diagnosis present

## 2016-07-29 LAB — CBC
HEMATOCRIT: 37.1 % (ref 36.0–46.0)
HEMOGLOBIN: 12.9 g/dL (ref 12.0–15.0)
MCH: 30.7 pg (ref 26.0–34.0)
MCHC: 34.8 g/dL (ref 30.0–36.0)
MCV: 88.3 fL (ref 78.0–100.0)
Platelets: 240 10*3/uL (ref 150–400)
RBC: 4.2 MIL/uL (ref 3.87–5.11)
RDW: 12.6 % (ref 11.5–15.5)
WBC: 6.7 10*3/uL (ref 4.0–10.5)

## 2016-07-29 LAB — COMPREHENSIVE METABOLIC PANEL
ALBUMIN: 3.5 g/dL (ref 3.5–5.0)
ALT: 17 U/L (ref 14–54)
ANION GAP: 6 (ref 5–15)
AST: 14 U/L — ABNORMAL LOW (ref 15–41)
Alkaline Phosphatase: 60 U/L (ref 38–126)
BUN: 13 mg/dL (ref 6–20)
CO2: 26 mmol/L (ref 22–32)
Calcium: 8.7 mg/dL — ABNORMAL LOW (ref 8.9–10.3)
Chloride: 106 mmol/L (ref 101–111)
Creatinine, Ser: 0.7 mg/dL (ref 0.44–1.00)
GFR calc Af Amer: >60 mL/min (ref >60)
GFR calc non Af Amer: >60 mL/min (ref >60)
GLUCOSE: 102 mg/dL — AB (ref 65–99)
POTASSIUM: 4.3 mmol/L (ref 3.5–5.1)
SODIUM: 138 mmol/L (ref 135–145)
TOTAL PROTEIN: 7 g/dL (ref 6.5–8.1)
Total Bilirubin: 0.8 mg/dL (ref 0.3–1.2)

## 2016-07-29 MED ORDER — AMOXICILLIN-POT CLAVULANATE 875-125 MG PO TABS: 1.0000 | ORAL_TABLET | Freq: Two times a day (BID) | ORAL | 0 refills | Status: AC

## 2016-07-29 NOTE — Progress Notes (Signed)
Went over d/c instructions with patient.  She verbalized understanding.  Left hospital via w/c and personal vehicle.

## 2016-07-29 NOTE — Discharge Instructions (Signed)

## 2016-07-29 NOTE — Discharge Summary (Signed)
Physician Discharge Summary  Lauren Nielsen [id number] DOB: [birth year] DOA: 07/28/2016  PCP: Kelton Pillar, MD  Admit date: 07/28/2016 Discharge date: 07/29/2016  Admitted From: Home Disposition:  Home  Recommendations for Outpatient Follow-up:  1. Follow up with PCP in 1 week. Patient will complete her outpatient antibiotic course of Augmentin on 9/14. (Total 10 days)   Home Health: None Equipment/Devices: None  Discharge Condition: Stable CODE STATUS: Full code Diet recommendation: Regular   Discharge Diagnoses:  Principal Problem:   Cat bite of right upper arm   Active Problems:   Right arm cellulitis   Brief narrative/history of present illness 40 year old female with history of depression presented to the ED with worsening rash in her right upper arm. 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 She was seen as outpatient and placed on Augmentin for cellulitis. However in the next 48 hours her rash worsened with increased pain and swelling. Patient presented to the ED where vitals were stable, normal labs and was placed under observation on IV antibiotics.   Hospital course Cat bite cellulitis of right upper arm Patient placed on empiric vancomycin and Zosyn. Symptoms much improved this morning with area of cellulitis remarkably reduced. Has minimal tenderness in the arm. Remains afebrile. Patient has received 2 doses of antibiotics. She is stable to be discharged home. She was prescribed 10 day course of Augmentin and took it for 2 days before coming to the hospital. I have instructed her to complete her antibiotic course and follow-up with her PCP next week. Also instructed to return to the ED if she has fevers/chills, increased swelling in her arm, increased pain, tingling or numbness in her arm. Can continue taking NSAIDs prn for pain.  Chronic depression Continue home medications  Patient stable to be discharged home.   Discharge Instructions     Medication List    STOP taking these medications   atorvastatin 10 MG tablet Commonly known as:  LIPITOR   HYDROcodone-acetaminophen 5-325 MG tablet Commonly known as:  NORCO/VICODIN     TAKE these medications   ALIGN 4 MG Caps Take 4 mg by mouth daily.   amoxicillin-clavulanate 875-125 MG tablet Commonly known as:  AUGMENTIN Take 1 tablet by mouth 2 (two) times daily.   aspirin-acetaminophen-caffeine [id number] MG tablet Commonly known as:  EXCEDRIN MIGRAINE Take 2 tablets by mouth daily as needed for headache or migraine.   buPROPion 75 MG tablet Commonly known as:  WELLBUTRIN Take one daily   citalopram 20 MG tablet Commonly known as:  CELEXA Take 1 tablet (20 mg total) by mouth daily.   diphenhydrAMINE 25 MG tablet Commonly known as:  BENADRYL Take 25 mg by mouth at bedtime as needed for allergies.   EPINEPHrine 0.3 mg/0.3 mL Soaj injection Commonly known as:  EPI-PEN Inject per instruction for severe allergic reaction   ibuprofen 200 MG tablet Commonly known as:  ADVIL,MOTRIN Take 400 mg by mouth every 6 (six) hours as needed for headache or moderate pain.   loperamide 2 MG tablet Commonly known as:  IMODIUM A-D Take 2-4 mg by mouth daily as needed for diarrhea or loose stools.   loratadine 10 MG tablet Commonly known as:  CLARITIN Take 10 mg by mouth at bedtime.   ranitidine 150 MG tablet Commonly known as:  ZANTAC Take 150 mg by mouth daily as needed for heartburn.      Follow-up Information    DEBBIE SCHOENHOFF,  MD. Schedule an appointment as soon as possible for a visit in 1 week(s).   Specialty:  Internal Medicine Contact information: [address] [place] [address] [phone]          Allergies  Allergen Reactions  . Ppd [Tuberculin Purified Protein Derivative] Anaphylaxis  . Latex Itching and Rash    Consultations:  none   Procedures/Studies: [place] Extrem Up Right Ltd  Result  Date: 07/29/2016 CLINICAL DATA:  Cat bite at the right upper arm.  Initial encounter. EXAM: ULTRASOUND RIGHT UPPER EXTREMITY LIMITED TECHNIQUE: Ultrasound examination of the upper extremity soft tissues was performed in the area of clinical concern. COMPARISON:  None. FINDINGS: Minimal foci of soft tissue edema are noted underlying the largest bite mark at the right upper medial arm. No hyperemia is seen to suggest abscess formation. IMPRESSION: Minimal foci of soft tissue edema underlying the largest bite mark at the right upper medial arm. No evidence of hyperemia to suggest abscess formation. Electronically Signed   By: Garald Balding M.D.   On: 07/29/2016 01:37     Subjective: Feels better. afebrile  Discharge Exam: Vitals:   07/28/16 2046 07/29/16 0645  BP: (!) 132/92 113/82  Pulse: 80 73  Resp: 20 18  Temp: 99 F (37.2 C) 98.3 F (36.8 C)   Vitals:   07/28/16 1346 07/28/16 1544 07/28/16 2046 07/29/16 0645  BP: 128/94 (!) 138/98 (!) 132/92 113/82  Pulse: 95 80 80 73  Resp: 18 18 20 18   Temp:  98.2 F (36.8 C) 99 F (37.2 C) 98.3 F (36.8 C)  TempSrc:  Oral Oral Oral  SpO2: 96% 98% 98% 97%  Weight:  98.1 kg (216 lb 4.3 oz)    Height:  5\' 3"  (1.6 m)      General: Middle aged female not in distress HEENT: Moist mucosa Cardiovascular: Normal S1 and S2, no murmurs Respiratory: CTA bilaterally, no wheezing, no rhonchi Abdominal: Soft, NT, ND, bowel sounds + Extremities: Minimal swelling over right upper arm with erythema, minimal warmth and tenderness, no discharge.    The results of significant diagnostics from this hospitalization (including imaging, microbiology, ancillary and laboratory) are listed below for reference.     Microbiology: No results found for this or any previous visit (from the past 240 hour(s)).   Labs: BNP (last 3 results) No results for input(s): BNP in the last 8760 hours. Basic Metabolic Panel:  Recent Labs Lab 07/28/16 1225 07/28/16 1904  07/29/16 0554  NA 133*  --  138  K 3.7  --  4.3  CL 100*  --  106  CO2 26  --  26  GLUCOSE 97  --  102*  BUN 13  --  13  CREATININE 0.57 0.83 0.70  CALCIUM 8.7*  --  8.7*   Liver Function Tests:  Recent Labs Lab 07/29/16 0554  AST 14*  ALT 17  ALKPHOS 60  BILITOT 0.8  PROT 7.0  ALBUMIN 3.5   No results for input(s): LIPASE, AMYLASE in the last 168 hours. No results for input(s): AMMONIA in the last 168 hours. CBC:  Recent Labs Lab 07/28/16 1225 07/28/16 1904 07/29/16 0554  WBC 9.0 8.3 6.7  NEUTROABS 5.8  --   --   HGB 13.4 13.1 12.9  HCT 37.6 35.8* 37.1  MCV 89.3 87.7 88.3  PLT 258 219 240   Cardiac Enzymes: No results for input(s): CKTOTAL, CKMB, CKMBINDEX, TROPONINI in the last 168 hours. BNP: Invalid input(s): POCBNP CBG:  No results for input(s): GLUCAP in the last 168 hours. D-Dimer No results for input(s): DDIMER in the last 72 hours. Hgb A1c No results for input(s): HGBA1C in the last 72 hours. Lipid Profile No results for input(s): CHOL, HDL, LDLCALC, TRIG, CHOLHDL, LDLDIRECT in the last 72 hours. Thyroid function studies No results for input(s): TSH, T4TOTAL, T3FREE, THYROIDAB in the last 72 hours.  Invalid input(s): FREET3 Anemia work up No results for input(s): VITAMINB12, FOLATE, FERRITIN, TIBC, IRON, RETICCTPCT in the last 72 hours. Urinalysis    Component Value Date/Time   BILIRUBINUR neg 02/18/2015 1056   PROTEINUR ne 02/18/2015 1056   UROBILINOGEN negative 02/18/2015 1056   NITRITE neg 02/18/2015 1056   LEUKOCYTESUR Negative 02/18/2015 1056   Sepsis Labs Invalid input(s): PROCALCITONIN,  WBC,  LACTICIDVEN Microbiology No results found for this or any previous visit (from the past 240 hour(s)).   Time coordinating discharge: <30 minutes  SIGNED:   Louellen Molder, MD  [facility] 07/29/2016, 9:36 AM Pager   If 7PM-7AM, please contact night-coverage [email or web address] Password [email or web address]

## 2016-08-02 LAB — CULTURE, BLOOD (ROUTINE X 2)
Culture: NO GROWTH
Culture: NO GROWTH

## 2016-10-07 ENCOUNTER — Other Ambulatory Visit: Payer: Self-pay | Admitting: Internal Medicine

## 2016-10-07 DIAGNOSIS — Z1231 Encounter for screening mammogram for malignant neoplasm of breast: Secondary | ICD-10-CM

## 2016-11-12 ENCOUNTER — Ambulatory Visit
Admission: RE | Admit: 2016-11-12 | Discharge: 2016-11-12 | Disposition: A | Discharge status: Home / Self Care | Payer: Commercial Managed Care - HMO | Source: Ambulatory Visit | Attending: Internal Medicine | Admitting: Internal Medicine

## 2016-11-12 DIAGNOSIS — Z1231 Encounter for screening mammogram for malignant neoplasm of breast: Secondary | ICD-10-CM

## 2016-12-10 DIAGNOSIS — Z91040 Latex allergy status: Secondary | ICD-10-CM | POA: Insufficient documentation

## 2017-01-08 DIAGNOSIS — Z6841 Body Mass Index (BMI) 40.0 and over, adult: Secondary | ICD-10-CM

## 2017-01-14 DIAGNOSIS — N951 Menopausal and female climacteric states: Secondary | ICD-10-CM | POA: Insufficient documentation

## 2017-09-23 ENCOUNTER — Encounter: Payer: Self-pay | Admitting: Family Medicine

## 2017-09-23 ENCOUNTER — Ambulatory Visit (INDEPENDENT_AMBULATORY_CARE_PROVIDER_SITE_OTHER): Payer: 59 | Admitting: Family Medicine

## 2017-09-23 DIAGNOSIS — M25531 Pain in right wrist: Secondary | ICD-10-CM

## 2017-09-23 DIAGNOSIS — M25552 Pain in left hip: Secondary | ICD-10-CM | POA: Diagnosis not present

## 2017-09-23 NOTE — Patient Instructions (Signed)
You have an extensor tendinitis of your right wrist (of the extensor digitorum). Ice the area 15 minutes at a time 3-4 times a day. Aleve 2 tabs twice a day with food OR ibuprofen 600mg  three times a day with food for pain and inflammation. Wear wrist brace as often as possible to rest this. Consider occupational therapy if not improving as expected. Follow up with me in 6 weeks if you're doing well.  You have IT band syndrome of your left hip. Avoid painful activities as much as possible. Ice over area of pain 3-4 times a day for 15 minutes at a time Standing hip rotations, hip side raise exercise 3 sets of 10 once a day - add weights if this becomes too easy. Stretches - pick 2-3 and hold for 20-30 seconds x 3 - do once or twice a day. Aleve or ibuprofen as noted above If not improving, can consider physical therapy and/or steroid injection. Follow up with me in 6 weeks.

## 2017-09-26 ENCOUNTER — Encounter: Payer: Self-pay | Admitting: Family Medicine

## 2017-09-26 DIAGNOSIS — M25552 Pain in left hip: Secondary | ICD-10-CM | POA: Insufficient documentation

## 2017-09-26 DIAGNOSIS — M25531 Pain in right wrist: Secondary | ICD-10-CM | POA: Insufficient documentation

## 2017-09-26 NOTE — Assessment & Plan Note (Signed)
2/2 extensor tendinitis.  Icing, aleve or ibuprofen.  Wrist brace to rest this.  Consider occupational therapy if not improving.  F/u in 6 weeks.

## 2017-09-26 NOTE — Assessment & Plan Note (Signed)
2/2 IT band syndrome.  Icing, shown home exercises and stretches to do daily.  Aleve or ibuprofen.  Consider PT, injection if not improving.  F/u in 6 weeks.

## 2017-09-26 NOTE — Progress Notes (Signed)
PCP: Lanice Shirts, MD  Subjective:   HPI: Patient is a 41 y.o. female here for right wrist, left hip pain.  Patient reports for a couple years she's had off and on dorsal right wrist pain but has become more constant past 1 1/2 weeks. She runs a dog sitting business. Has been icing, using KT tape, and taken ibuprofen some. Feels pain traveling up the arm on this side. No numbness, skin changes. Feels swollen. Right handed. Pain level 2/10 but up to 4/10 and sharp at times. Also reports lateral left hip pain worse with lying on this side. Has not tried anything for the hip.   Past Medical History:  Diagnosis Date  . Anxiety   . Asperger's disorder   . Depression   . Endometriosis   . Hyperlipidemia   . Migraines   . PONV (postoperative nausea and vomiting)     Current Outpatient Medications on File Prior to Visit  Medication Sig Dispense Refill  . albuterol (PROAIR HFA) 108 (90 Base) MCG/ACT inhaler     . aspirin-acetaminophen-caffeine (EXCEDRIN MIGRAINE) 250-250-65 MG tablet Take 2 tablets by mouth daily as needed for headache or migraine.    Marland Kitchen buPROPion (WELLBUTRIN) 75 MG tablet Take one daily 90 tablet 1  . citalopram (CELEXA) 40 MG tablet Take 40 mg daily by mouth.  1  . diphenhydrAMINE (BENADRYL) 25 MG tablet Take 25 mg by mouth at bedtime as needed for allergies.    Marland Kitchen EPINEPHrine (EPI-PEN) 0.3 mg/0.3 mL SOAJ injection Inject per instruction for severe allergic reaction 1 Device 1  . ibuprofen (ADVIL,MOTRIN) 200 MG tablet Take 400 mg by mouth every 6 (six) hours as needed for headache or moderate pain.    Marland Kitchen loperamide (IMODIUM A-D) 2 MG tablet Take 2-4 mg by mouth daily as needed for diarrhea or loose stools.    Marland Kitchen loratadine (CLARITIN) 10 MG tablet Take 10 mg by mouth at bedtime.    Marland Kitchen orlistat (ALLI) 60 MG capsule Take by mouth.    . pravastatin (PRAVACHOL) 10 MG tablet     . Probiotic Product (ALIGN) 4 MG CAPS Take 4 mg by mouth daily.    . ranitidine  (ZANTAC) 150 MG tablet Take 150 mg by mouth daily as needed for heartburn.     No current facility-administered medications on file prior to visit.     Past Surgical History:  Procedure Laterality Date  . ABDOMINAL HYSTERECTOMY    . HERNIA REPAIR    . LAPAROSCOPY    . LAPAROTOMY      Allergies  Allergen Reactions  . Ppd [Tuberculin Purified Protein Derivative] Anaphylaxis  . Latex Itching and Rash    Social History   Socioeconomic History  . Marital status: Married    Spouse name: Not on file  . Number of children: Not on file  . Years of education: Not on file  . Highest education level: Not on file  Social Needs  . Financial resource strain: Not on file  . Food insecurity - worry: Not on file  . Food insecurity - inability: Not on file  . Transportation needs - medical: Not on file  . Transportation needs - non-medical: Not on file  Occupational History  . Occupation: Higher education careers adviser: [employer]  Tobacco Use  . Smoking status: Former Smoker    Years: 0.50  . Smokeless tobacco: Never Used  Substance and Sexual Activity  . Alcohol use: No    Alcohol/week: 0.0  oz  . Drug use: Yes    Comment: marijuana in 20's  . Sexual activity: Yes    Partners: Female, Female    Birth control/protection: Surgical  Other Topics Concern  . Not on file  Social History Narrative  . Not on file    Family History  Problem Relation Age of Onset  . Cancer Mother        breast,uterine,liver,melanoma,  . Cancer Father        prostate and brain  . Depression Brother   . Hypothyroidism Brother     BP (!) 146/105   Pulse 83   Ht 5\' 3"  (1.6 m)   Wt 228 lb (103.4 kg)   LMP 07/24/2012   BMI 40.39 kg/m   Review of Systems: See HPI above.     Objective:  Physical Exam:  Gen: NAD, comfortable in exam room  Right hand/wrist: No gross deformity, swelling, bruising. TTP dorsal wrist over extensor digitorum.  No other tenderness of wrist or hand. Mild  limitation extension but full flexion.  FROM digits without pain. Negative tinels, finkelsteins. NVI distally. Sensation intact to light touch.  Left hand/wrist: No deformity. FROM digits and wrist without pain. 5/5 strength digits and wrist. NVI distally.  Left hip: No gross deformity. TTP proximal IT band.  No other tenderness including piriformis, SI joint. FROM with 4/5 strength hip abduction, painful. Negative piriformis and fabers. NVI distally.   Assessment & Plan:  1. Right wrist pain - 2/2 extensor tendinitis.  Icing, aleve or ibuprofen.  Wrist brace to rest this.  Consider occupational therapy if not improving.  F/u in 6 weeks.  2. Left hip pain - 2/2 IT band syndrome.  Icing, shown home exercises and stretches to do daily.  Aleve or ibuprofen.  Consider PT, injection if not improving.  F/u in 6 weeks.

## 2017-11-04 ENCOUNTER — Ambulatory Visit: Payer: 59 | Admitting: Family Medicine

## 2017-11-04 ENCOUNTER — Encounter: Payer: Self-pay | Admitting: Family Medicine

## 2017-11-04 DIAGNOSIS — M25531 Pain in right wrist: Secondary | ICD-10-CM

## 2017-11-04 MED ORDER — MELOXICAM 7.5 MG PO TABS: 7.5000 mg | ORAL_TABLET | Freq: Every day | ORAL | 2 refills | Status: DC

## 2017-11-04 MED FILL — MELOXICAM 7.5 MG TABLET: 7.5 | 30 days supply | Qty: 30 | Fill #0 | Status: TO

## 2017-11-04 NOTE — Patient Instructions (Signed)
You have an extensor tendinitis/tenosynovitis of your right wrist (of the extensor digitorum). Ice the area 15 minutes at a time 3-4 times a day. Meloxicam 7.5mg  daily with food for pain and inflammation. Tylenol 500mg  1-2 tabs three times a day for pain. Topical capsaicin OR salon pas for pain as well. Wear wrist brace as often as possible to rest this. Consider occupational therapy. If you do any exercises I'd focus on strengthening of these tendons with play doh or putty (squeezes and splaying ones 3 sets of 10). Follow up with me in 6 weeks.

## 2017-11-06 ENCOUNTER — Encounter: Payer: Self-pay | Admitting: Family Medicine

## 2017-11-06 NOTE — Assessment & Plan Note (Signed)
2/2 extensor digitorum tendinitis and tenosynovitis.  Not improving to date.  Continue with brace.  Icing, add meloxicam at low dose.  Tylenol, topical medications.  Consider occupational therapy.  F/u in 6 weeks.

## 2017-11-06 NOTE — Progress Notes (Signed)
PCP: Lanice Shirts, MD  Subjective:   HPI: Patient is a 41 y.o. female here for right wrist, left hip pain.  11/1: Patient reports for a couple years she's had off and on dorsal right wrist pain but has become more constant past 1 1/2 weeks. She runs a dog sitting business. Has been icing, using KT tape, and taken ibuprofen some. Feels pain traveling up the arm on this side. No numbness, skin changes. Feels swollen. Right handed. Pain level 2/10 but up to 4/10 and sharp at times. Also reports lateral left hip pain worse with lying on this side. Has not tried anything for the hip.  12/13: Patient reports she feels the same or worse than last visit. Pain level currently 2/10 but worse and sharp at times dorsally right wrist. Wearing brace most of the time and at night. Tried ibuprofen for a few days, irritating her stomach. Has been icing and doing some rehab exercises she found online. Pain worse at end of day. No skin changes, numbness. + swelling.  Past Medical History:  Diagnosis Date  . Anxiety   . Asperger's disorder   . Depression   . Endometriosis   . Hyperlipidemia   . Migraines   . PONV (postoperative nausea and vomiting)     Current Outpatient Medications on File Prior to Visit  Medication Sig Dispense Refill  . albuterol (PROAIR HFA) 108 (90 Base) MCG/ACT inhaler     . aspirin-acetaminophen-caffeine (EXCEDRIN MIGRAINE) 250-250-65 MG tablet Take 2 tablets by mouth daily as needed for headache or migraine.    Marland Kitchen buPROPion (WELLBUTRIN) 75 MG tablet Take one daily 90 tablet 1  . citalopram (CELEXA) 40 MG tablet Take 40 mg daily by mouth.  1  . diphenhydrAMINE (BENADRYL) 25 MG tablet Take 25 mg by mouth at bedtime as needed for allergies.    Marland Kitchen EPINEPHrine (EPI-PEN) 0.3 mg/0.3 mL SOAJ injection Inject per instruction for severe allergic reaction 1 Device 1  . ibuprofen (ADVIL,MOTRIN) 200 MG tablet Take 400 mg by mouth every 6 (six) hours as needed for  headache or moderate pain.    Marland Kitchen loperamide (IMODIUM A-D) 2 MG tablet Take 2-4 mg by mouth daily as needed for diarrhea or loose stools.    Marland Kitchen loratadine (CLARITIN) 10 MG tablet Take 10 mg by mouth at bedtime.    Marland Kitchen orlistat (ALLI) 60 MG capsule Take by mouth.    . pravastatin (PRAVACHOL) 10 MG tablet     . Probiotic Product (ALIGN) 4 MG CAPS Take 4 mg by mouth daily.    . ranitidine (ZANTAC) 150 MG tablet Take 150 mg by mouth daily as needed for heartburn.     No current facility-administered medications on file prior to visit.     Past Surgical History:  Procedure Laterality Date  . ABDOMINAL HYSTERECTOMY    . HERNIA REPAIR    . LAPAROSCOPY    . LAPAROTOMY      Allergies  Allergen Reactions  . Ppd [Tuberculin Purified Protein Derivative] Anaphylaxis  . Latex Itching and Rash    Social History   Socioeconomic History  . Marital status: Married    Spouse name: Not on file  . Number of children: Not on file  . Years of education: Not on file  . Highest education level: Not on file  Social Needs  . Financial resource strain: Not on file  . Food insecurity - worry: Not on file  . Food insecurity - inability: Not on file  .  Transportation needs - medical: Not on file  . Transportation needs - non-medical: Not on file  Occupational History  . Occupation: Higher education careers adviser: [employer]  Tobacco Use  . Smoking status: Former Smoker    Years: 0.50  . Smokeless tobacco: Never Used  Substance and Sexual Activity  . Alcohol use: No    Alcohol/week: 0.0 oz  . Drug use: Yes    Comment: marijuana in 20's  . Sexual activity: Yes    Partners: Female, Female    Birth control/protection: Surgical  Other Topics Concern  . Not on file  Social History Narrative  . Not on file    Family History  Problem Relation Age of Onset  . Cancer Mother        breast,uterine,liver,melanoma,  . Cancer Father        prostate and brain  . Depression Brother   . Hypothyroidism  Brother     BP (!) 145/95   Pulse 87   Ht 5\' 2"  (1.575 m)   Wt 230 lb (104.3 kg)   LMP 07/24/2012   BMI 42.07 kg/m   Review of Systems: See HPI above.     Objective:  Physical Exam:  Gen: NAD, comfortable in exam room.  Right hand/wrist: No deformity, bruising.  Mild dorsal swelling of wrist. TTP dorsal wrist over extensor digitorum.  No other tenderness of wrist or hand. Mild limitation of extension but full flexion.  FROM digits and wrist - pain with wrist and digit extension. Negative tinels. Sensation intact to light touch. NVI distally.  MSK u/s right wrist:  Neovascularity of extensor digitorum tendons with tenosynovitis at level of wrist joint.  Assessment & Plan:  1. Right wrist pain - 2/2 extensor digitorum tendinitis and tenosynovitis.  Not improving to date.  Continue with brace.  Icing, add meloxicam at low dose.  Tylenol, topical medications.  Consider occupational therapy.  F/u in 6 weeks.

## 2017-11-24 ENCOUNTER — Other Ambulatory Visit: Payer: Self-pay | Admitting: Radiology

## 2017-11-24 DIAGNOSIS — D242 Benign neoplasm of left breast: Secondary | ICD-10-CM | POA: Diagnosis not present

## 2017-11-24 DIAGNOSIS — Z803 Family history of malignant neoplasm of breast: Secondary | ICD-10-CM | POA: Diagnosis not present

## 2017-11-30 DIAGNOSIS — Z803 Family history of malignant neoplasm of breast: Secondary | ICD-10-CM | POA: Diagnosis not present

## 2017-11-30 DIAGNOSIS — Z90710 Acquired absence of both cervix and uterus: Secondary | ICD-10-CM | POA: Diagnosis not present

## 2017-11-30 DIAGNOSIS — Z6841 Body Mass Index (BMI) 40.0 and over, adult: Secondary | ICD-10-CM | POA: Diagnosis not present

## 2017-12-15 ENCOUNTER — Telehealth: Payer: Self-pay | Admitting: Genetics

## 2017-12-15 ENCOUNTER — Encounter: Payer: Self-pay | Admitting: Genetics

## 2017-12-15 NOTE — Telephone Encounter (Signed)
Genetic counseling appt has been scheduled for the pt to see Ria Comment on 3/4 at 3pm. Pt aware to arrive 30 minutes early. Letter mailed to the pt and faxed to the referring with the appt information.

## 2017-12-16 ENCOUNTER — Ambulatory Visit: Payer: Commercial Managed Care - HMO | Admitting: Family Medicine

## 2017-12-30 ENCOUNTER — Ambulatory Visit: Payer: Commercial Managed Care - HMO | Admitting: Family Medicine

## 2018-01-17 ENCOUNTER — Other Ambulatory Visit: Payer: Self-pay | Admitting: Family Medicine

## 2018-01-24 ENCOUNTER — Encounter: Payer: Self-pay | Admitting: Genetics

## 2018-01-24 ENCOUNTER — Inpatient Hospital Stay: Payer: 59 | Attending: Genetic Counselor | Admitting: Genetics

## 2018-01-24 ENCOUNTER — Inpatient Hospital Stay: Payer: 59

## 2018-01-24 DIAGNOSIS — Z803 Family history of malignant neoplasm of breast: Secondary | ICD-10-CM

## 2018-01-24 DIAGNOSIS — Z1379 Encounter for other screening for genetic and chromosomal anomalies: Secondary | ICD-10-CM | POA: Diagnosis not present

## 2018-01-24 DIAGNOSIS — Z808 Family history of malignant neoplasm of other organs or systems: Secondary | ICD-10-CM | POA: Diagnosis not present

## 2018-01-24 DIAGNOSIS — Z8042 Family history of malignant neoplasm of prostate: Secondary | ICD-10-CM | POA: Diagnosis not present

## 2018-01-24 NOTE — Progress Notes (Addendum)
REFERRING PROVIDER: REFERRING PROVIDER: Clydie Braun, MD [address] [place], [place] [address]  PRIMARY PROVIDER:  Coralyn Mark, Altamese Cabal, MD  PRIMARY REASON FOR VISIT:  1. Family history of breast cancer   2. Family history of prostate cancer   3. Family history of melanoma     HISTORY OF PRESENT ILLNESS:   Ms. Lauren Nielsen, a 42 y.o. female, was seen for a [facility] cancer genetics consultation at the request of Dr. Earleen Newport due to a family history of cancer.  [name] presents to clinic today to discuss the possibility of a hereditary predisposition to cancer, genetic testing, and to further clarify her future cancer risks, as well as potential cancer risks for family members.   [name] is a 42 y.o. female with no personal history of cancer.  She does have a fibroadenoma of the left breast that was identified on 11/24/2017.    CANCER HISTORY:   No history exists.    HORMONAL RISK FACTORS:  Menarche was at age 26.  First live birth at age N/A.  OCP use for approximately 'A couple of years' years.  Ovaries intact: yes. 1 ovary in, 1 was removed during hysterectomy.  Hysterectomy: yes.  Menopausal status: perimenopausal.  HRT use: 0 years. Colonoscopy: yes; reportedly normal. Mammogram within the last year: yes. Number of breast biopsies: 1.  Past Medical History:  Diagnosis Date  . Anxiety   . Asperger's disorder   . Depression   . Endometriosis   . Family history of breast cancer   . Family history of melanoma   . Family history of prostate cancer   . Hyperlipidemia   . Migraines   . PONV (postoperative nausea and vomiting)     Past Surgical History:  Procedure Laterality Date  . ABDOMINAL HYSTERECTOMY    . HERNIA REPAIR    . LAPAROSCOPY    . LAPAROTOMY      Social History   Socioeconomic History  . Marital status: Married    Spouse name: Not on file  . Number of children: Not on file  . Years of education: Not on file  . Highest education level: Not  on file  Social Needs  . Financial resource strain: Not on file  . Food insecurity - worry: Not on file  . Food insecurity - inability: Not on file  . Transportation needs - medical: Not on file  . Transportation needs - non-medical: Not on file  Occupational History  . Occupation: Higher education careers adviser: [employer]  Tobacco Use  . Smoking status: Former Smoker    Years: 0.50  . Smokeless tobacco: Never Used  Substance and Sexual Activity  . Alcohol use: No    Alcohol/week: 0.0 oz  . Drug use: Yes    Comment: marijuana in 20's  . Sexual activity: Yes    Partners: Female, Female    Birth control/protection: Surgical  Other Topics Concern  . Not on file  Social History Narrative  . Not on file     FAMILY HISTORY:  We obtained a detailed, 4-generation family history.  Significant diagnoses are listed below: Family History  Problem Relation Age of Onset  . Cancer Mother        breast,uterine,liver,melanoma,  . Cancer Father        prostate and brain  . Depression Brother   . Hypothyroidism Brother    Ms, [name] has no children.  She has a brother who is 1 with no  history of cancer.  He has 2 children.  [name] also has a paternal half-brother who died at 62 (not cancer).    [name] fahter died at 43 due to metastatic prostate cancer that spread to his brain.  He was first diagnosed with prostate cancer in his 55's.  [name] father was an only child.  [name] paternal grandfather died in his 2's due to emphysema.  [name] paternal grandmother died in her 52's with no history of cancer.   [name] mother was diagnosed with melanoma at 49 and breast cancer at 55.  Her breast cancer was metastatic, and spread to her bone and liver.  She died at the age of 106.  [name] reports she did have a lot of sun exposure growing up 'at the beach all day'.  [name] has 2 maternal uncles.  1 maternal uncle is 41 and was diagnosed with melanoma in his  21's  He had no children.  [name] has a second maternal uncle who is 37 with no history of cancer.  [name] maternal grandfather died at 95 due to a heart attack, but had cancer (lung maybe).  [name] maternal grandmother died at 64 due to breast cancer.     [name] is unaware of previous family history of genetic testing for hereditary cancer risks. Patient's maternal ancestors are of Northern European descent, and paternal ancestors are of Northern European descent. There is no reported Ashkenazi Jewish ancestry. There is no known consanguinity.  GENETIC COUNSELING ASSESSMENT: Mariyah N Olberding is a 42 y.o. female with a family history which is somewhat suggestive of a Hereditary Cancer Predisposition Syndrome. We, therefore, discussed and recommended the following at today's visit.   DISCUSSION: We reviewed the characteristics, features and inheritance patterns of hereditary cancer syndromes. We also discussed genetic testing, including the appropriate family members to test, the process of testing, insurance coverage and turn-around-time for results. We discussed the implications of a negative, positive and/or variant of uncertain significant result. We recommended Ms. [facility] pursue genetic testing for the Common Hereditary Cancers gene panel + Melanoma Panel. The Common Hereditary Cancer Panel offered by [facility] includes sequencing and/or deletion duplication testing of the following 47 genes: APC, ATM, AXIN2, BARD1, BMPR1A, BRCA1, BRCA2, BRIP1, CDH1, CDKN2A (p14ARF), CDKN2A (p16INK4a), CKD4, CHEK2, CTNNA1, DICER1, EPCAM (Deletion/duplication testing only), GREM1 (promoter region deletion/duplication testing only), KIT, MEN1, MLH1, MSH2, MSH3, MSH6, MUTYH, NBN, NF1, NHTL1, PALB2, PDGFRA, PMS2, POLD1, POLE, PTEN, RAD50, RAD51C, RAD51D, SDHB, SDHC, SDHD, SMAD4, SMARCA4. STK11, TP53, TSC1, TSC2, and VHL.  The following genes were evaluated for sequence changes only: SDHA and HOXB13 c.251G>A  variant only.  [facility] Melanoma Panel (9) Genes:  BAP1, BRCA2, CDK4, CDKN2A, MITF, POT1, PTEN, RB1, TP53, BRCA1, MC1R, TERT  We discussed that only 5-10% of cancers are associated with a Hereditary cancer predisposition syndrome.  One of the most common hereditary cancer syndromes that increases breast cancer risk is called Hereditary Breast and Ovarian Cancer (HBOC) syndrome.  This syndrome is caused by mutations in the BRCA1 and BRCA2 genes.  This syndrome increases an individual's lifetime risk to develop breast, ovarian, pancreatic, and other types of cancer.  There are also many other cancer predisposition syndromes caused by mutations in several other genes.  We discussed that if she is found to have a mutation in one of these genes, it may impact future medical management recommendations such as increased cancer screenings and consideration of risk reducing surgeries.  A positive result could also have implications for the patient's family members.  A Negative result would mean we were unable to identify a hereditary component to her family's cancer, but does not rule out the possibility of a hereditary basis for her family history of cancer cancer.  There could be mutations that are undetectable by current technology, or in genes not yet tested or identified to increase cancer risk.    We discussed the potential to find a Variant of Uncertain Significance or VUS.  These are variants that have not yet been identified as pathogenic or benign, and it is unknown if this variant is associated with increased cancer risk or if this is a normal finding.  Most VUS's are reclassified to benign or likely benign.   It should not be used to make medical management decisions. With time, we suspect the lab will determine the significance of any VUS's identified if any.   Based on Ms. Doan's family history of cancer, she meets medical criteria for genetic testing. Despite that she meets criteria, she may still  have an out of pocket cost. We discussed that if her out of pocket cost for testing is over $100, the laboratory will call and confirm whether she wants to proceed with testing.  If the out of pocket cost of testing is less than $100 she will be billed by the genetic testing laboratory.   Based on the patient's personal and family history, the statistical model  (Tyrer Cusik)   Was used to estimate her risk of developing breast cancer. This estimates her lifetime risk of developing breast cancer to be approximately 29.3%. This estimation is performed in the case of negative genetic testing results.   The patient's lifetime breast cancer risk is a preliminary estimate based on available information using one of several models endorsed by the [place] (ACS). The ACS recommends consideration of breast MRI screening as an adjunct to mammography for patients at high risk (defined as 20% or greater lifetime risk). A more detailed breast cancer risk assessment can be considered, if clinically indicated.   Ms. Passey has been determined to be at high risk for breast cancer.  Therefore, we recommend that annual screening with mammography and breast MRI begin at age 78, or 10 years prior to the age of breast cancer diagnosis in a relative (whichever is earlier).  We discussed that Ms. Monteleone should discuss her individual situation with her referring physician and determine a breast cancer screening plan with which they are both comfortable.      We discussed that some people do not want to undergo genetic testing due to fear of genetic discrimination.  A federal law called the Genetic Information Non-Discrimination Act (GINA) of 2008 helps protect individuals against genetic discrimination based on their genetic test results.  It impacts both health insurance and employment.  For health insurance, it protects against increased premiums, being kicked off insurance or being forced to take a test in order  to be insured.  For employment it protects against hiring, firing and promoting decisions based on genetic test results.  Health status due to a cancer diagnosis is not protected under GINA.  This law does not protect life insurance, disability insurance, or other types of insurance.   PLAN: After considering the risks, benefits, and limitations, Ms. Gonser  provided informed consent to pursue genetic testing and the blood sample was sent to [facility] for analysis of the Common Hereditary Cancers Panel + Melanoma  Panel. Results should be available within approximately 2-3 weeks' time, at which point they will be disclosed by telephone to Ms. Rodick, as will any additional recommendations warranted by these results. Ms. Sevcik will receive a summary of her genetic counseling visit and a copy of her results once available. This information will also be available in Epic. We encouraged Ms. Reisner to remain in contact with cancer genetics annually so that we can continuously update the family history and inform her of any changes in cancer genetics and testing that may be of benefit for her family. Ms. Lehrmann questions were answered to her satisfaction today. Our contact information was provided should additional questions or concerns arise.  Based on Ms. Bostick's family history, we recommended her brothers and all maternal and paternal relatives, have genetic counseling and testing. Ms. Engebretsen will let us know if we can be of any assistance in coordinating genetic counseling and/or testing for this family member.   Lastly, we encouraged Ms. Littlepage to remain in contact with cancer genetics annually so that we can continuously update the family history and inform her of any changes in cancer genetics and testing that may be of benefit for this family.   Ms.  Forrey questions were answered to her satisfaction today. Our contact information was provided should additional questions or concerns arise.  Thank you for the referral and allowing [place] to share in the care of your patient.   Tana Felts, MS, [facility] Certified Genetic Counselor [email or web address] phone: [phone]  The patient was seen for a total of 50 minutes in face-to-face genetic counseling.  The patient was accompanied today by her Wife.

## 2018-02-04 ENCOUNTER — Telehealth: Payer: Self-pay | Admitting: Genetics

## 2018-02-07 NOTE — Telephone Encounter (Signed)
Revealed negative genetic testing.  Revealed that a VUS in APC was identified.  We discussed that we do not know why there is cancer in the family. It could be due to a different gene that we are not testing, or maybe our current technology may not be able to pick something up.  We discussed it may also be that there is a hereditary cause for her family history of cancer that she did not inherit and therefore was not found in her genetic testing.  Testing other family members may help to clarify this.  We recommended her brother and all maternal family members have genetic testing.  Revealed that her tyrer cusik breast risk estimation was about 29%, we therefore recommended she still have high risk breast screening (MRI in addition to annual mammogram) despite negative genetic test results.  However, if other relatives had genetic testing and we found a genetic mutation to explain her family history of cancer that Ms. Canale did not inherit, this would likely impact her risk.    It will be important for her to keep in contact with genetics to learn if additional testing may be needed in the future.

## 2018-02-09 ENCOUNTER — Encounter: Payer: Self-pay | Admitting: Genetics

## 2018-02-09 ENCOUNTER — Ambulatory Visit: Payer: Self-pay | Admitting: Genetics

## 2018-02-09 DIAGNOSIS — Z1379 Encounter for other screening for genetic and chromosomal anomalies: Secondary | ICD-10-CM | POA: Insufficient documentation

## 2018-02-09 DIAGNOSIS — Z8042 Family history of malignant neoplasm of prostate: Secondary | ICD-10-CM

## 2018-02-09 DIAGNOSIS — Z808 Family history of malignant neoplasm of other organs or systems: Secondary | ICD-10-CM

## 2018-02-09 DIAGNOSIS — Z803 Family history of malignant neoplasm of breast: Secondary | ICD-10-CM

## 2018-02-09 NOTE — Progress Notes (Signed)
HPI: Lauren Nielsen was previously seen in the [facility] clinic on 01/24/2018 due to a family history of breast, melanoma, and prostate cancer,  and concerns regarding a hereditary predisposition to cancer. Please refer to our prior cancer genetics clinic note for more information regarding Lauren Nielsen's medical, social and family histories, and our assessment and recommendations, at the time. Lauren Nielsen recent genetic test results were disclosed to her, as well as recommendations warranted by these results. These results and recommendations are discussed in more detail below.  CANCER HISTORY:   No history exists.     FAMILY HISTORY:  We obtained a detailed, 4-generation family history.  Significant diagnoses are listed below: Family History  Problem Relation Age of Onset  . Cancer Mother        breast,uterine,liver,melanoma,  . Cancer Father        prostate and brain  . Depression Brother   . Hypothyroidism Brother     Lauren Nielsen has no children.  She has a brother who is 81 with no history of cancer.  He has 2 children.  Lauren Nielsen also has a paternal half-brother who died at 37 (not cancer).    Lauren Nielsen fahther died at 62 due to metastatic prostate cancer that spread to his brain.  He was first diagnosed with prostate cancer in his 88's.  Lauren Nielsen father was an only child.  Lauren Nielsen paternal grandfather died in his 41's due to emphysema.  Lauren Nielsen paternal grandmother died in her 2's with no history of cancer.   Lauren Nielsen mother was diagnosed with melanoma at 57 and breast cancer at 62.  Her breast cancer was metastatic, and spread to her bone and liver.  She died at the age of 63.  Lauren Nielsen reports she did have a lot of sun exposure growing up 'at the beach all day'.  Lauren Nielsen has 2 maternal uncles.  1 maternal uncle is 34 and was diagnosed with melanoma in his 54's  He had no children.  Lauren Nielsen has a second maternal uncle who is 8 with no  history of cancer.  Lauren Nielsen maternal grandfather died at 49 due to a heart attack, but had cancer (lung maybe).  Lauren Nielsen maternal grandmother died at 56 due to breast cancer.    Lauren Nielsen is unaware of previous family history of genetic testing for hereditary cancer risks. Patient's maternal ancestors are of Northern European descent, and paternal ancestors are of [place] European descent. There is no reported Ashkenazi Jewish ancestry. There is no known consanguinity.  GENETIC TEST RESULTS: Genetic testing performed through [facility]'s Common Hereditary Cancer Panel + Melanoma Panel reported out on 02/02/2018 showed no pathogenic mutations. The following genes were evaluated for sequence changes and exonic deletions/duplications: APC, ATM, AXIN2, BAP1, BARD1, BMPR1A, BRCA1, BRCA2, BRIP1, CDH1, CDK4, CDKN2A (p14ARF), CDKN2A (p16INK4a), CHEK2, CTNNA1, DICER1, EPCAM*, GREM1*, KIT, MC1R, MEN1, MLH1, MSH2, MSH3, MSH6, MUTYH, NBN, NF1, PALB2, PDGFRA, PMS2, POLD1, POLE, POT1, PTEN, RAD50, RAD51C, RAD51D, RB1, SDHB, SDHC, SDHD, SMAD4, SMARCA4, STK11, TERT, TP53, TSC1, TSC2, VHL.  The following genes were evaluated for sequence changes only: HOXB13*, MITF*, NTHL1*, SDHA.  A variant of uncertain significance (VUS) in a gene called APC was also noted. c.[id number] (p.[id number])  The test report will be scanned into EPIC and will be located under the Molecular Pathology section of the Results Review tab.A portion of the result report is included below for reference.     We discussed with  Lauren Nielsen that because current genetic testing is not perfect, it is possible there may be a gene mutation in one of these genes that current testing cannot detect, but that chance is small. We also discussed, that there could be another gene that has not yet been discovered, or that we have not yet tested, that is responsible for the cancer diagnoses in the family. It is also possible there is a hereditary cause for the  cancer in the family that Lauren Nielsen did not inherit and therefore was not identified in her testing.  Therefore, it is important to remain in touch with cancer genetics in the future so that we can continue to offer Lauren Nielsen the most up to date genetic testing.   Regarding the VUS in APC: At this time, it is unknown if this variant is associated with increased cancer risk or if this is a normal finding, but most variants such as this get reclassified to being inconsequential. It should not be used to make medical management decisions. With time, we suspect the lab will determine the significance of this variant, if any. If we do learn more about it, we will try to contact Lauren Nielsen. Sulser to discuss it further. However, it is important to stay in touch with [place] periodically and keep the address and phone number up to date.  ADDITIONAL GENETIC TESTING: We discussed with Lauren Nielsen. Archila that there are other genes that are associated with increased cancer risk that can be analyzed. The laboratories that offer this testing look at these additional genes via a hereditary cancer gene panel. Should Lauren Nielsen. Riveron wish to pursue additional genetic testing, we are happy to discuss and coordinate this testing, at any time.    CANCER SCREENING RECOMMENDATIONS: Given Lauren Nielsen. Passarella's personal and family histories, we must consider these negative results carefully.  Families with features suggestive of hereditary risk for cancer tend to have multiple family members with cancer, diagnoses in multiple generations, and diagnoses before the age of 79. Lauren Nielsen. Petrucelli family exhibits some of these features. Therefore this negative result may actually be due to the limitations of current technology to detect all mutations within these genes, or there may be a different gene that has not yet been discovered or tested.  It is also possible there is a hereditary cause for the cancer in Lauren Nielsen. Apo's family that she did not inherit and therefore was  not identified in her genetic testing.   Lauren Nielsen. Dekoning was advised to continue following the cancer screening guidelines provided by her primary healthcare providers. Other factors such as her personal and family history may still affect her cancer risk.    Based on the patient's personal and family history, the statistical model  (Tyrer Cusik) was used to estimate her risk of developing breast cancer. This estimates her lifetime risk of developing breast cancer to be approximately 29.3%. The patient's lifetime breast cancer risk is a preliminary estimate based on available information using one of several models endorsed by the [place] (ACS). The ACS recommends consideration of breast MRI screening as an adjunct to mammography for patients at high risk (defined as 20% or greater lifetime risk). A more detailed breast cancer risk assessment can be considered, if clinically indicated.   Lauren Nielsen. Mantell has been determined to be at high risk for breast cancer. Therefore, we recommend that annual screening with mammography and breast MRI begin at age 54, or 10 years prior to the age of breast cancer diagnosis in a  relative (whichever is earlier). We discussed that Lauren Nielsen. Gresham should discuss her individual situation with her referring physician and determine a breast cancer screening plan with which they are both comfortable.     RECOMMENDATIONS FOR FAMILY MEMBERS: Individuals in this family might be at some increased risk of developing cancer, over the general population risk, simply due to the family history of cancer. We recommended women in this family have a yearly mammogram beginning at age 35, or 44 years younger than the earliest onset of cancer, an annual clinical breast exam, and perform monthly breast self-exams. Women in this family should also have a gynecological exam as recommended by their primary provider. All family members should have a colonoscopy by age 59.  All family  members should inform their physicians about the family history of cancer so their doctors can make the most appropriate screening recommendations for them.   Based on Lauren Nielsen. Cassin's family history, we recommended all paternal and maternal relatives, have genetic counseling and testing. Lauren Nielsen. Jessee will let us know if we can be of any assistance in coordinating genetic counseling and/or testing for *these family members.   FOLLOW-UP: Lastly, we discussed with Lauren Nielsen. Deis that cancer genetics is a rapidly advancing field and it is possible that new genetic tests will be appropriate for her and/or her family members in the future. We encouraged her to remain in contact with cancer genetics on an annual basis so we can update her personal and family histories and let her know of advances in cancer genetics that may benefit this family.   Our contact number was provided. Lauren Nielsen. Presswood questions were answered to her satisfaction, and she knows she is welcome to call us at anytime with additional questions or concerns.   Ferol Luz, Lauren Nielsen, [facility] Certified Genetic Counselor [email or web address]

## 2018-02-16 DIAGNOSIS — J0100 Acute maxillary sinusitis, unspecified: Secondary | ICD-10-CM | POA: Diagnosis not present

## 2018-05-03 ENCOUNTER — Other Ambulatory Visit: Payer: Self-pay | Admitting: Family Medicine

## 2018-07-20 DIAGNOSIS — N6321 Unspecified lump in the left breast, upper outer quadrant: Secondary | ICD-10-CM | POA: Diagnosis not present

## 2018-10-02 DIAGNOSIS — B349 Viral infection, unspecified: Secondary | ICD-10-CM | POA: Diagnosis not present

## 2018-10-02 DIAGNOSIS — J45909 Unspecified asthma, uncomplicated: Secondary | ICD-10-CM | POA: Diagnosis not present

## 2018-10-07 DIAGNOSIS — K580 Irritable bowel syndrome with diarrhea: Secondary | ICD-10-CM | POA: Diagnosis not present

## 2018-10-07 DIAGNOSIS — K644 Residual hemorrhoidal skin tags: Secondary | ICD-10-CM | POA: Diagnosis not present

## 2018-10-07 DIAGNOSIS — K625 Hemorrhage of anus and rectum: Secondary | ICD-10-CM | POA: Diagnosis not present

## 2018-10-11 DIAGNOSIS — E785 Hyperlipidemia, unspecified: Secondary | ICD-10-CM | POA: Diagnosis not present

## 2018-10-11 DIAGNOSIS — I10 Essential (primary) hypertension: Secondary | ICD-10-CM | POA: Diagnosis not present

## 2018-10-11 DIAGNOSIS — J329 Chronic sinusitis, unspecified: Secondary | ICD-10-CM | POA: Diagnosis not present

## 2018-10-11 DIAGNOSIS — Z23 Encounter for immunization: Secondary | ICD-10-CM | POA: Diagnosis not present

## 2018-10-11 DIAGNOSIS — T7840XA Allergy, unspecified, initial encounter: Secondary | ICD-10-CM | POA: Diagnosis not present

## 2018-10-24 DIAGNOSIS — L814 Other melanin hyperpigmentation: Secondary | ICD-10-CM | POA: Diagnosis not present

## 2018-10-24 DIAGNOSIS — D225 Melanocytic nevi of trunk: Secondary | ICD-10-CM | POA: Diagnosis not present

## 2018-10-24 DIAGNOSIS — D235 Other benign neoplasm of skin of trunk: Secondary | ICD-10-CM | POA: Diagnosis not present

## 2018-11-01 DIAGNOSIS — Z803 Family history of malignant neoplasm of breast: Secondary | ICD-10-CM | POA: Diagnosis not present

## 2018-11-01 DIAGNOSIS — Z1231 Encounter for screening mammogram for malignant neoplasm of breast: Secondary | ICD-10-CM | POA: Diagnosis not present

## 2018-11-08 DIAGNOSIS — Z0100 Encounter for examination of eyes and vision without abnormal findings: Secondary | ICD-10-CM | POA: Diagnosis not present

## 2018-12-12 DIAGNOSIS — I10 Essential (primary) hypertension: Secondary | ICD-10-CM | POA: Diagnosis not present

## 2018-12-12 DIAGNOSIS — R5383 Other fatigue: Secondary | ICD-10-CM | POA: Diagnosis not present

## 2018-12-19 DIAGNOSIS — K649 Unspecified hemorrhoids: Secondary | ICD-10-CM | POA: Diagnosis not present

## 2018-12-19 DIAGNOSIS — R197 Diarrhea, unspecified: Secondary | ICD-10-CM | POA: Diagnosis not present

## 2018-12-19 DIAGNOSIS — K625 Hemorrhage of anus and rectum: Secondary | ICD-10-CM | POA: Diagnosis not present

## 2018-12-20 DIAGNOSIS — H66001 Acute suppurative otitis media without spontaneous rupture of ear drum, right ear: Secondary | ICD-10-CM | POA: Diagnosis not present

## 2018-12-20 DIAGNOSIS — M791 Myalgia, unspecified site: Secondary | ICD-10-CM | POA: Diagnosis not present

## 2018-12-20 DIAGNOSIS — Z20828 Contact with and (suspected) exposure to other viral communicable diseases: Secondary | ICD-10-CM | POA: Diagnosis not present

## 2018-12-21 DIAGNOSIS — K625 Hemorrhage of anus and rectum: Secondary | ICD-10-CM | POA: Diagnosis not present

## 2019-12-09 ENCOUNTER — Other Ambulatory Visit: Payer: Self-pay

## 2019-12-09 ENCOUNTER — Encounter (HOSPITAL_COMMUNITY): Payer: Self-pay

## 2019-12-09 DIAGNOSIS — Z5321 Procedure and treatment not carried out due to patient leaving prior to being seen by health care provider: Secondary | ICD-10-CM | POA: Insufficient documentation

## 2019-12-09 DIAGNOSIS — R0789 Other chest pain: Secondary | ICD-10-CM | POA: Diagnosis present

## 2019-12-09 MED ORDER — SODIUM CHLORIDE 0.9% FLUSH: 3.0000 mL | Freq: Once | INTRAVENOUS | Status: DC

## 2019-12-09 NOTE — ED Notes (Signed)
EKG given to EDP,Knapp,I.,MD., for review.

## 2019-12-09 NOTE — ED Triage Notes (Signed)
Pt states that she started experiencing chest pain about 1115p. She states that she has been fine all day prior. Now endorses nausea, chest pain, and shaking. Denies SOB. A&Ox4.

## 2019-12-10 ENCOUNTER — Emergency Department (HOSPITAL_COMMUNITY): Payer: 59

## 2019-12-10 ENCOUNTER — Emergency Department (HOSPITAL_COMMUNITY)
Admission: EM | Admit: 2019-12-10 | Discharge: 2019-12-10 | Disposition: A | Discharge status: Home / Self Care | Payer: 59 | Attending: Emergency Medicine | Admitting: Emergency Medicine

## 2019-12-10 LAB — BASIC METABOLIC PANEL
Anion gap: 10 (ref 5–15)
BUN: 17 mg/dL (ref 6–20)
CO2: 24 mmol/L (ref 22–32)
Calcium: 9.2 mg/dL (ref 8.9–10.3)
Chloride: 104 mmol/L (ref 98–111)
Creatinine, Ser: 0.77 mg/dL (ref 0.44–1.00)
GFR calc Af Amer: >60 mL/min (ref >60)
GFR calc non Af Amer: >60 mL/min (ref >60)
Glucose, Bld: 133 mg/dL — ABNORMAL HIGH (ref 70–99)
Potassium: 3.5 mmol/L (ref 3.5–5.1)
Sodium: 138 mmol/L (ref 135–145)

## 2019-12-10 LAB — CBC
HCT: 43.1 % (ref 36.0–46.0)
Hemoglobin: 14.8 g/dL (ref 12.0–15.0)
MCH: 31.2 pg (ref 26.0–34.0)
MCHC: 34.3 g/dL (ref 30.0–36.0)
MCV: 90.9 fL (ref 80.0–100.0)
Platelets: 319 10*3/uL (ref 150–400)
RBC: 4.74 MIL/uL (ref 3.87–5.11)
RDW: 12.2 % (ref 11.5–15.5)
WBC: 8.7 10*3/uL (ref 4.0–10.5)
nRBC: 0 % (ref 0.0–0.2)

## 2019-12-10 LAB — I-STAT BETA HCG BLOOD, ED (NOT ORDERABLE): I-stat hCG, quantitative: <5 m[IU]/mL (ref <5)

## 2019-12-10 LAB — TROPONIN I (HIGH SENSITIVITY): Troponin I (High Sensitivity): 5 ng/L (ref <18)

## 2019-12-10 NOTE — ED Notes (Addendum)
Attempted lab draw, pt. Vomiting. RN,Jake made aware.

## 2019-12-27 NOTE — Progress Notes (Signed)
Referring-Lauren Schoenhoff MD Reason for referral-Chest pain and palpitations  HPI: 44 yo female for evaluation of chest pain and palpitations at request of Emi Belfast MD. Seen in ER with CP 12/09/19; chest xray negative, troponin normal, hgb 14.8.  Over the past 2 years she has had intermittent palpitations described as her heart racing.  They initially lasted seconds.  She had one in January that lasted for 2 minutes.  There was associated numbness in her upper extremities, nausea and vomiting.  There was no dyspnea.  There was an uncomfortable feeling in her chest.  Her symptoms resolved spontaneously.  She otherwise does not have dyspnea on exertion, orthopnea, PND, pedal edema, chest pain or syncope.  Cardiology now asked to evaluate.  Current Outpatient Medications  Medication Sig Dispense Refill  . aspirin-acetaminophen-caffeine (EXCEDRIN MIGRAINE) 250-250-65 MG tablet Take 2 tablets by mouth daily as needed for headache or migraine.    Marland Kitchen buPROPion (WELLBUTRIN XL) 150 MG 24 hr tablet Take 150 mg by mouth daily.    . citalopram (CELEXA) 40 MG tablet Take 40 mg daily by mouth.  1  . diphenhydrAMINE (BENADRYL) 25 MG tablet Take 25 mg by mouth at bedtime as needed for allergies.    Marland Kitchen EPINEPHrine (EPI-PEN) 0.3 mg/0.3 mL SOAJ injection Inject per instruction for severe allergic reaction 1 Device 1  . ibuprofen (ADVIL,MOTRIN) 200 MG tablet Take 400 mg by mouth every 6 (six) hours as needed for headache or moderate pain.    Marland Kitchen loperamide (IMODIUM A-D) 2 MG tablet Take 2-4 mg by mouth daily as needed for diarrhea or loose stools.    Marland Kitchen loratadine (CLARITIN) 10 MG tablet Take 10 mg by mouth at bedtime.    . meloxicam (MOBIC) 7.5 MG tablet TAKE 1 TABLET BY MOUTH EVERY DAY 30 tablet 0  . orlistat (ALLI) 60 MG capsule Take by mouth.    . pravastatin (PRAVACHOL) 10 MG tablet      No current facility-administered medications for this visit.    Allergies  Allergen Reactions  . Ppd  [Tuberculin Purified Protein Derivative] Anaphylaxis  . Latex Itching and Rash     Past Medical History:  Diagnosis Date  . Anxiety   . Asperger's disorder   . Depression   . Endometriosis   . Family history of breast cancer   . Family history of melanoma   . Family history of prostate cancer   . Hyperlipidemia   . Hypertension   . IBS (irritable bowel syndrome)   . Migraines   . PONV (postoperative nausea and vomiting)     Past Surgical History:  Procedure Laterality Date  . ABDOMINAL HYSTERECTOMY    . HERNIA REPAIR    . LAPAROSCOPY    . LAPAROTOMY      Social History   Socioeconomic History  . Marital status: Married    Spouse name: Not on file  . Number of children: Not on file  . Years of education: Not on file  . Highest education level: Not on file  Occupational History  . Occupation: Higher education careers adviser: [employer]  Tobacco Use  . Smoking status: Never Smoker  . Smokeless tobacco: Never Used  Substance and Sexual Activity  . Alcohol use: Yes    Alcohol/week: 0.0 standard drinks    Comment: Rarely  . Drug use: Yes    Comment: marijuana in 20's  . Sexual activity: Yes    Partners: Female, Female    Birth control/protection:  Surgical  Other Topics Concern  . Not on file  Social History Narrative  . Not on file   Social Determinants of Health   Financial Resource Strain:   . Difficulty of Paying Living Expenses: Not on file  Food Insecurity:   . Worried About Charity fundraiser in the Last Year: Not on file  . Ran Out of Food in the Last Year: Not on file  Transportation Needs:   . Lack of Transportation (Medical): Not on file  . Lack of Transportation (Non-Medical): Not on file  Physical Activity:   . Days of Exercise per Week: Not on file  . Minutes of Exercise per Session: Not on file  Stress:   . Feeling of Stress : Not on file  Social Connections:   . Frequency of Communication with Friends and Family: Not on file  .  Frequency of Social Gatherings with Friends and Family: Not on file  . Attends Religious Services: Not on file  . Active Member of Clubs or Organizations: Not on file  . Attends Archivist Meetings: Not on file  . Marital Status: Not on file  Intimate Partner Violence:   . Fear of Current or Ex-Partner: Not on file  . Emotionally Abused: Not on file  . Physically Abused: Not on file  . Sexually Abused: Not on file    Family History  Problem Relation Age of Onset  . Cancer Mother        breast,uterine,liver,melanoma,  . Cancer Father        prostate and brain  . Depression Brother   . Hypothyroidism Brother     ROS: no fevers or chills, productive cough, hemoptysis, dysphasia, odynophagia, melena, hematochezia, dysuria, hematuria, rash, seizure activity, orthopnea, PND, pedal edema, claudication. Remaining systems are negative.  Physical Exam:   Blood pressure 122/86, pulse 98, height 5\' 3"  (1.6 m), weight 232 lb 9.6 oz (105.5 kg), last menstrual period 07/24/2012, SpO2 99 %.  General:  Well developed/well nourished in NAD Skin warm/dry; tattoos Patient not depressed No peripheral clubbing Back-normal HEENT-normal/normal eyelids Neck supple/normal carotid upstroke bilaterally; no bruits; no JVD; no thyromegaly chest - CTA/ normal expansion CV - RRR/normal S1 and S2; no murmurs, rubs or gallops;  PMI nondisplaced Abdomen -NT/ND, no HSM, no mass, + bowel sounds, no bruit 2+ femoral pulses, no bruits Ext-no edema, chords, 2+ DP Neuro-grossly nonfocal  ECG - 12/09/19-NSR, no ST changes; personally reviewed  A/P  1 chest pain-mild chest discomfort with palpitations but otherwise no chest pain with exertion.  Previous electrocardiogram showed no ST changes.  We will not pursue further ischemia evaluation.  2 Palpitations-etiology unclear and symptoms relatively infrequent.  We discussed alivecor to document rhythm disturbance at time of symptoms and she will  purchase.  We will arrange an echocardiogram to assess LV function.  Can consider a beta-blocker in the future if needed.  3 hyperlipidemia-continue statin.  Per primary care.  Kirk Ruths, MD

## 2020-01-02 ENCOUNTER — Encounter: Payer: Self-pay | Admitting: Cardiology

## 2020-01-02 ENCOUNTER — Ambulatory Visit: Payer: 59 | Admitting: Cardiology

## 2020-01-02 ENCOUNTER — Other Ambulatory Visit: Payer: Self-pay

## 2020-01-02 VITALS — BP 122/86 | HR 98 | Ht 63.0 in | Wt 232.6 lb

## 2020-01-02 DIAGNOSIS — R072 Precordial pain: Secondary | ICD-10-CM

## 2020-01-02 DIAGNOSIS — R002 Palpitations: Secondary | ICD-10-CM

## 2020-01-02 NOTE — Patient Instructions (Signed)
Medication Instructions:  NO CHANGE *If you need a refill on your cardiac medications before your next appointment, please call your pharmacy*  Lab Work: If you have labs (blood work) drawn today and your tests are completely normal, you will receive your results only by: Marland Kitchen MyChart Message (if you have MyChart) OR . A paper copy in the mail If you have any lab test that is abnormal or we need to change your treatment, we will call you to review the results.  Testing/Procedures: Your physician has requested that you have an echocardiogram. Echocardiography is a painless test that uses sound waves to create images of your heart. It provides your doctor with information about the size and shape of your heart and how well your heart's chambers and valves are working. This procedure takes approximately one hour. There are no restrictions for this procedure.[place]    Follow-Up: At [facility], you and your health needs are our priority.  As part of our continuing mission to provide you with exceptional heart care, we have created designated Provider Care Teams.  These Care Teams include your primary Cardiologist (physician) and Advanced Practice Providers (APPs -  Physician Assistants and Nurse Practitioners) who all work together to provide you with the care you need, when you need it.  Your next appointment:   3 month(s)  The format for your next appointment:   In Person  Provider:   Kirk Ruths, MD  Other Instructions ALIVECOR-CALL [phone] FOR DIRECTIONS HOW TO SEND EKG'S VIA MY CHART

## 2020-01-12 ENCOUNTER — Other Ambulatory Visit: Payer: Self-pay

## 2020-01-12 ENCOUNTER — Ambulatory Visit (HOSPITAL_COMMUNITY): Payer: 59 | Attending: Cardiology

## 2020-01-12 DIAGNOSIS — R002 Palpitations: Secondary | ICD-10-CM | POA: Diagnosis not present

## 2020-01-12 MED ORDER — PERFLUTREN LIPID MICROSPHERE
1.0000 mL | INTRAVENOUS | Status: AC | PRN
  Administered 2020-01-12: 2 mL via INTRAVENOUS

## 2020-03-25 NOTE — Progress Notes (Signed)
HPI: FU CP. Seen in ER with CP 12/09/19; chest xray negative, troponin normal, hgb 14.8.  Also with palpitations.  Echocardiogram February 2021 showed normal LV function, mild left ventricular hypertrophy, grade 1 diastolic dysfunction.  Since last seen she denies dyspnea, chest pain or syncope.  Occasional palpitations that she captured with her device that appears to show sinus rhythm.  Current Outpatient Medications  Medication Sig Dispense Refill  . aspirin-acetaminophen-caffeine (EXCEDRIN MIGRAINE) 250-250-65 MG tablet Take 2 tablets by mouth daily as needed for headache or migraine.    Marland Kitchen buPROPion (WELLBUTRIN XL) 300 MG 24 hr tablet Take by mouth daily.     . citalopram (CELEXA) 40 MG tablet Take 40 mg daily by mouth.  1  . diphenhydrAMINE (BENADRYL) 25 MG tablet Take 25 mg by mouth at bedtime as needed for allergies.    Marland Kitchen EPINEPHrine (EPI-PEN) 0.3 mg/0.3 mL SOAJ injection Inject per instruction for severe allergic reaction 1 Device 1  . ibuprofen (ADVIL,MOTRIN) 200 MG tablet Take 400 mg by mouth every 6 (six) hours as needed for headache or moderate pain.    Marland Kitchen loperamide (IMODIUM A-D) 2 MG tablet Take 2-4 mg by mouth daily as needed for diarrhea or loose stools.    Marland Kitchen loratadine (CLARITIN) 10 MG tablet Take 10 mg by mouth at bedtime.    . rosuvastatin (CRESTOR) 10 MG tablet Take 10 mg by mouth 3 (three) times a week.    . testosterone cypionate (DEPOTESTOSTERONE CYPIONATE) 200 MG/ML injection 0.25 mg.     No current facility-administered medications for this visit.     Past Medical History:  Diagnosis Date  . Anxiety   . Asperger's disorder   . Depression   . Endometriosis   . Family history of breast cancer   . Family history of melanoma   . Family history of prostate cancer   . Hyperlipidemia   . Hypertension   . IBS (irritable bowel syndrome)   . Migraines   . PONV (postoperative nausea and vomiting)     Past Surgical History:  Procedure Laterality Date  .  ABDOMINAL HYSTERECTOMY    . HERNIA REPAIR    . LAPAROSCOPY    . LAPAROTOMY      Social History   Socioeconomic History  . Marital status: Married    Spouse name: Not on file  . Number of children: Not on file  . Years of education: Not on file  . Highest education level: Not on file  Occupational History  . Occupation: Higher education careers adviser: [employer]  Tobacco Use  . Smoking status: Never Smoker  . Smokeless tobacco: Never Used  Substance and Sexual Activity  . Alcohol use: Yes    Alcohol/week: 0.0 standard drinks    Comment: Rarely  . Drug use: Yes    Comment: marijuana in 20's  . Sexual activity: Yes    Partners: Female, Female    Birth control/protection: Surgical  Other Topics Concern  . Not on file  Social History Narrative  . Not on file   Social Determinants of Health   Financial Resource Strain:   . Difficulty of Paying Living Expenses:   Food Insecurity:   . Worried About Charity fundraiser in the Last Year:   . Arboriculturist in the Last Year:   Transportation Needs:   . Film/video editor (Medical):   Marland Kitchen Lack of Transportation (Non-Medical):   Physical Activity:   .  Days of Exercise per Week:   . Minutes of Exercise per Session:   Stress:   . Feeling of Stress :   Social Connections:   . Frequency of Communication with Friends and Family:   . Frequency of Social Gatherings with Friends and Family:   . Attends Religious Services:   . Active Member of Clubs or Organizations:   . Attends Archivist Meetings:   Marland Kitchen Marital Status:   Intimate Partner Violence:   . Fear of Current or Ex-Partner:   . Emotionally Abused:   Marland Kitchen Physically Abused:   . Sexually Abused:     Family History  Problem Relation Age of Onset  . Cancer Mother        breast,uterine,liver,melanoma,  . Cancer Father        prostate and brain  . Depression Brother   . Hypothyroidism Brother     ROS: no fevers or chills, productive cough, hemoptysis,  dysphasia, odynophagia, melena, hematochezia, dysuria, hematuria, rash, seizure activity, orthopnea, PND, pedal edema, claudication. Remaining systems are negative.  Physical Exam: Well-developed well-nourished in no acute distress.  Skin is warm and dry.  HEENT is normal.  Neck is supple.  Chest is clear to auscultation with normal expansion.  Cardiovascular exam is regular rate and rhythm.  Abdominal exam nontender or distended. No masses palpated. Extremities show no edema. neuro grossly intact  ECG-December 09, 2019-sinus rhythm with no ST changes.  Personally reviewed  A/P  1 palpitations-echocardiogram shows normal LV function.  She has had no significant recurrences since last office visit.  She has Environmental manager.  We can consider a beta-blocker in the future if needed.  2 chest pain-patient only had chest pain during her palpitations.  Otherwise no exertional symptoms.  No plans for further ischemia evaluation.  3 hyperlipidemia-continue statin.  Per primary care.  4 hypertension-patient's blood pressure is elevated.  However she is having some difficulties with anxiety.  I have asked her to track this and we will adjust regimen as needed.  We will contact her so that she can give information concerning exactly which blood pressure medicine she is taking.  Kirk Ruths, MD

## 2020-04-08 ENCOUNTER — Other Ambulatory Visit: Payer: Self-pay

## 2020-04-08 ENCOUNTER — Ambulatory Visit: Payer: 59 | Admitting: Cardiology

## 2020-04-08 ENCOUNTER — Encounter: Payer: Self-pay | Admitting: Cardiology

## 2020-04-08 VITALS — BP 128/100 | HR 87 | Ht 62.0 in | Wt 215.8 lb

## 2020-04-08 DIAGNOSIS — E7800 Pure hypercholesterolemia, unspecified: Secondary | ICD-10-CM

## 2020-04-08 DIAGNOSIS — I10 Essential (primary) hypertension: Secondary | ICD-10-CM | POA: Diagnosis not present

## 2020-04-08 DIAGNOSIS — R002 Palpitations: Secondary | ICD-10-CM | POA: Diagnosis not present

## 2020-04-08 NOTE — Patient Instructions (Signed)

## 2020-04-09 ENCOUNTER — Encounter: Payer: Self-pay | Admitting: *Deleted

## 2020-04-09 NOTE — Telephone Encounter (Signed)
Left message for pt to call with her current medications.

## 2020-04-23 NOTE — Telephone Encounter (Signed)
Left message for pt to call.

## 2020-04-30 ENCOUNTER — Telehealth: Payer: Self-pay | Admitting: Cardiology

## 2020-04-30 NOTE — Telephone Encounter (Signed)
Med list updated  Routed to MD/RN

## 2020-04-30 NOTE — Telephone Encounter (Signed)
Pt c/o medication issue:  1. Name of Medication:  Benazepril 5 MG  Amlodipine Besylate 5 MG  2. How are you currently taking this medication (dosage and times per day)? 1 tablet once a day for both medication  3. Are you having a reaction (difficulty breathing--STAT)? No   4. What is your medication issue? Lauren Nielsen is calling to give her BP medications due to Dr. Stanford Breed requesting to know what they were and have her call in with the names, dosage, and how she takes them. Please advise.

## 2020-04-30 NOTE — Telephone Encounter (Signed)
This encounter was created in error - please disregard.

## 2020-06-16 ENCOUNTER — Emergency Department (HOSPITAL_BASED_OUTPATIENT_CLINIC_OR_DEPARTMENT_OTHER)
Admission: EM | Admit: 2020-06-16 | Discharge: 2020-06-16 | Disposition: A | Discharge status: Home / Self Care | Payer: 59 | Attending: Emergency Medicine | Admitting: Emergency Medicine

## 2020-06-16 ENCOUNTER — Other Ambulatory Visit: Payer: Self-pay

## 2020-06-16 ENCOUNTER — Encounter (HOSPITAL_BASED_OUTPATIENT_CLINIC_OR_DEPARTMENT_OTHER): Payer: Self-pay | Admitting: Emergency Medicine

## 2020-06-16 ENCOUNTER — Emergency Department (HOSPITAL_BASED_OUTPATIENT_CLINIC_OR_DEPARTMENT_OTHER): Payer: 59

## 2020-06-16 DIAGNOSIS — Z91040 Latex allergy status: Secondary | ICD-10-CM | POA: Insufficient documentation

## 2020-06-16 DIAGNOSIS — L7682 Other postprocedural complications of skin and subcutaneous tissue: Secondary | ICD-10-CM | POA: Diagnosis not present

## 2020-06-16 DIAGNOSIS — N6489 Other specified disorders of breast: Secondary | ICD-10-CM

## 2020-06-16 DIAGNOSIS — R0789 Other chest pain: Secondary | ICD-10-CM | POA: Insufficient documentation

## 2020-06-16 DIAGNOSIS — I10 Essential (primary) hypertension: Secondary | ICD-10-CM | POA: Diagnosis not present

## 2020-06-16 DIAGNOSIS — Z79899 Other long term (current) drug therapy: Secondary | ICD-10-CM | POA: Insufficient documentation

## 2020-06-16 LAB — URINALYSIS, ROUTINE W REFLEX MICROSCOPIC
Bilirubin Urine: NEGATIVE
Glucose, UA: NEGATIVE mg/dL
Hgb urine dipstick: NEGATIVE
Ketones, ur: 15 mg/dL — AB
Leukocytes,Ua: NEGATIVE
Nitrite: NEGATIVE
Protein, ur: NEGATIVE mg/dL
Specific Gravity, Urine: 1.02 (ref 1.005–1.030)
pH: 6.5 (ref 5.0–8.0)

## 2020-06-16 LAB — COMPREHENSIVE METABOLIC PANEL
ALT: 10 U/L (ref 0–44)
AST: 11 U/L — ABNORMAL LOW (ref 15–41)
Albumin: 3.8 g/dL (ref 3.5–5.0)
Alkaline Phosphatase: 72 U/L (ref 38–126)
Anion gap: 12 (ref 5–15)
BUN: 7 mg/dL (ref 6–20)
CO2: 23 mmol/L (ref 22–32)
Calcium: 8.9 mg/dL (ref 8.9–10.3)
Chloride: 101 mmol/L (ref 98–111)
Creatinine, Ser: 0.73 mg/dL (ref 0.44–1.00)
GFR calc Af Amer: >60 mL/min (ref >60)
GFR calc non Af Amer: >60 mL/min (ref >60)
Glucose, Bld: 112 mg/dL — ABNORMAL HIGH (ref 70–99)
Potassium: 3.9 mmol/L (ref 3.5–5.1)
Sodium: 136 mmol/L (ref 135–145)
Total Bilirubin: 0.5 mg/dL (ref 0.3–1.2)
Total Protein: 7.2 g/dL (ref 6.5–8.1)

## 2020-06-16 LAB — CBC WITH DIFFERENTIAL/PLATELET
Abs Immature Granulocytes: 0.05 10*3/uL (ref 0.00–0.07)
Basophils Absolute: 0 10*3/uL (ref 0.0–0.1)
Basophils Relative: 0 %
Eosinophils Absolute: 0.4 10*3/uL (ref 0.0–0.5)
Eosinophils Relative: 4 %
HCT: 46.4 % — ABNORMAL HIGH (ref 36.0–46.0)
Hemoglobin: 15.9 g/dL — ABNORMAL HIGH (ref 12.0–15.0)
Immature Granulocytes: 1 %
Lymphocytes Relative: 23 %
Lymphs Abs: 1.9 10*3/uL (ref 0.7–4.0)
MCH: 31.2 pg (ref 26.0–34.0)
MCHC: 34.3 g/dL (ref 30.0–36.0)
MCV: 91 fL (ref 80.0–100.0)
Monocytes Absolute: 0.6 10*3/uL (ref 0.1–1.0)
Monocytes Relative: 7 %
Neutro Abs: 5.4 10*3/uL (ref 1.7–7.7)
Neutrophils Relative %: 65 %
Platelets: 382 10*3/uL (ref 150–400)
RBC: 5.1 MIL/uL (ref 3.87–5.11)
RDW: 12.4 % (ref 11.5–15.5)
WBC: 8.4 10*3/uL (ref 4.0–10.5)
nRBC: 0 % (ref 0.0–0.2)

## 2020-06-16 LAB — LACTIC ACID, PLASMA: Lactic Acid, Venous: 0.8 mmol/L (ref 0.5–1.9)

## 2020-06-16 MED ORDER — ONDANSETRON HCL 4 MG/2ML IJ SOLN
4.0000 mg | Freq: Once | INTRAMUSCULAR | Status: AC
  Administered 2020-06-16: 4 mg via INTRAVENOUS
  Filled 2020-06-16: qty 2

## 2020-06-16 MED ORDER — SODIUM CHLORIDE 0.9 % IV BOLUS
1000.0000 mL | Freq: Once | INTRAVENOUS | Status: AC
  Administered 2020-06-16: 1000 mL via INTRAVENOUS

## 2020-06-16 MED ORDER — MORPHINE SULFATE (PF) 4 MG/ML IV SOLN
4.0000 mg | Freq: Once | INTRAVENOUS | Status: AC
  Administered 2020-06-16: 4 mg via INTRAVENOUS
  Filled 2020-06-16: qty 1

## 2020-06-16 MED ORDER — IOHEXOL 350 MG/ML SOLN
100.0000 mL | Freq: Once | INTRAVENOUS | Status: AC | PRN
  Administered 2020-06-16: 100 mL via INTRAVENOUS

## 2020-06-16 NOTE — ED Notes (Signed)
Pt returned from CT °

## 2020-06-16 NOTE — ED Notes (Signed)
Pt states that he was doing well after surgery but went in and had the drain removed. Woke up yesterday c/o nausea, chills, and severe pain. Taking oxycodone without relief.

## 2020-06-16 NOTE — ED Provider Notes (Signed)
[facility] Provider Note   CSN: [id number] Arrival date & time: 06/16/20  1510     History Chief Complaint  Patient presents with   Chills    Lauren Nielsen is a 44 y.o. adult.  44 year old transgender female to female patient with complaint of chills, chest wall pain, feeling generally very poorly today. Patient had elective bilateral mastectomy on 06/07/20, states she took the pain medication for the first few days and was doing well, transitioned to motrin/tylenol. Patient noticed drainage from her drain sites and returned to her surgeon's office where her drains were removed 1 day earlier than planned, was told everything was healing well/as expected. Patient had pain with drain removal and returned to taking the percocet for pain for a few days. Patient states she is feeling so poorly she is taking her percocet again for pain. Denies vomiting, changes in bowel or bladder habits, cough/URI symptoms, shortness of breath. No other complaints or concerns.         Past Medical History:  Diagnosis Date   Anxiety    Asperger's disorder    Depression    Endometriosis    Family history of breast cancer    Family history of melanoma    Family history of prostate cancer    Hyperlipidemia    Hypertension    IBS (irritable bowel syndrome)    Migraines    PONV (postoperative nausea and vomiting)     Patient Active Problem List   Diagnosis Date Noted   Genetic testing 02/09/2018   Family history of breast cancer    Family history of prostate cancer    Family history of melanoma    Right wrist pain 09/26/2017   Left hip pain 09/26/2017   Menopausal symptoms 01/14/2017   Morbid obesity with BMI of 40.0-44.9, adult (Derma) 01/08/2017   Latex allergy 12/10/2016   Right knee pain 01/07/2015   Diarrhea 09/02/2013   S/P hysterectomy 09/02/2013   History of migraine headaches 08/23/2012   Endometriosis 06/30/2012   Family  history of breast cancer in first degree relative 06/30/2012   CHRONIC RHINITIS 12/15/2010   Essential hypertension 10/10/2010   HYPERLIPIDEMIA 10/09/2010   DEPRESSION 10/09/2010    Past Surgical History:  Procedure Laterality Date   ABDOMINAL HYSTERECTOMY     HERNIA REPAIR     LAPAROSCOPY     LAPAROTOMY       OB History    Gravida  0   Para  0   Term  0   Preterm  0   AB  0   Living  0     SAB  0   TAB  0   Ectopic  0   Multiple  0   Live Births              Family History  Problem Relation Age of Onset   Cancer Mother        breast,uterine,liver,melanoma,   Cancer Father        prostate and brain   Depression Brother    Hypothyroidism Brother     Social History   Tobacco Use   Smoking status: Never Smoker   Smokeless tobacco: Never Used  Substance Use Topics   Alcohol use: Yes    Alcohol/week: 0.0 standard drinks    Comment: Rarely   Drug use: Yes    Comment: marijuana in 20's    Home Medications Prior to Admission medications   Medication Sig  Start Date End Date Taking? Authorizing Provider  amLODipine (NORVASC) 5 MG tablet Take 5 mg by mouth daily.    [provider]  aspirin-acetaminophen-caffeine (EXCEDRIN MIGRAINE) [phone] MG tablet Take 2 tablets by mouth daily as needed for headache or migraine.    [provider]  benazepril (LOTENSIN) 5 MG tablet Take 5 mg by mouth daily.    [provider]  buPROPion (WELLBUTRIN XL) 300 MG 24 hr tablet Take by mouth daily.     [provider]  citalopram (CELEXA) 40 MG tablet Take 40 mg daily by mouth. 08/07/17   [provider]  diphenhydrAMINE (BENADRYL) 25 MG tablet Take 25 mg by mouth at bedtime as needed for allergies.    [provider]  EPINEPHrine (EPI-PEN) 0.3 mg/0.3 mL SOAJ injection Inject per instruction for severe allergic reaction 10/04/13   Schoenhoff, Altamese Cabal, MD  ibuprofen (ADVIL,MOTRIN) 200 MG tablet Take  400 mg by mouth every 6 (six) hours as needed for headache or moderate pain.    [provider]  loperamide (IMODIUM A-D) 2 MG tablet Take 2-4 mg by mouth daily as needed for diarrhea or loose stools.    [provider]  loratadine (CLARITIN) 10 MG tablet Take 10 mg by mouth at bedtime.    [provider]  rosuvastatin (CRESTOR) 10 MG tablet Take 10 mg by mouth 3 (three) times a week.    [provider]  testosterone cypionate (DEPOTESTOSTERONE CYPIONATE) 200 MG/ML injection 0.25 mg. 03/20/20   [provider]    Allergies    Ppd [tuberculin purified protein derivative] and Latex  Review of Systems   Review of Systems  Constitutional: Positive for chills. Negative for diaphoresis and fever.  Respiratory: Negative for cough and shortness of breath.   Cardiovascular: Negative for chest pain.  Gastrointestinal: Positive for nausea. Negative for abdominal pain, constipation, diarrhea and vomiting.  Genitourinary: Negative for dysuria and frequency.  Musculoskeletal: Negative for arthralgias and myalgias.  Skin: Positive for wound.  Allergic/Immunologic: Negative for immunocompromised state.  Neurological: Negative for weakness.  All other systems reviewed and are negative.   Physical Exam Updated Vital Signs BP (!) 141/84 (BP Location: Right Arm)    Pulse 78    Temp 99.9 F (37.7 C) (Oral)    Resp 20    Ht 5\' 2"  (1.575 m)    Wt (!) 93.4 kg    LMP 07/24/2012    SpO2 99%    BMI 37.68 kg/m   Physical Exam Vitals and nursing note reviewed.  Constitutional:      General: She is not in acute distress.    Appearance: She is well-developed. She is not diaphoretic.  HENT:     Head: Normocephalic and atraumatic.  Cardiovascular:     Rate and Rhythm: Normal rate and regular rhythm.     Pulses: Normal pulses.     Heart sounds: Normal heart sounds.  Pulmonary:     Effort: Pulmonary effort is normal.     Breath sounds: Normal breath sounds.    Abdominal:     Palpations: Abdomen is soft.     Tenderness: There is no abdominal tenderness.  Musculoskeletal:     Right lower leg: No edema.     Left lower leg: No edema.  Skin:    General: Skin is warm and dry.     Findings: No erythema or rash.     Comments: Surgical incisions to chest wall appear to be healing well, no erythema,  drainage, dehiscence.  Grafted nipples appear irritated with central area which is black bilaterally.  Patient states surgeon explained that they would blister, appearance has not changed since postop follow-up and there were no concerns at that time.  Neurological:     Mental Status: She is alert and oriented to person, place, and time.  Psychiatric:        Behavior: Behavior normal.     ED Results / Procedures / Treatments   Labs (all labs ordered are listed, but only abnormal results are displayed) Labs Reviewed  CBC WITH DIFFERENTIAL/PLATELET - Abnormal; Notable for the following components:      Result Value   Hemoglobin 15.9 (*)    HCT 46.4 (*)    All other components within normal limits  COMPREHENSIVE METABOLIC PANEL - Abnormal; Notable for the following components:   Glucose, Bld 112 (*)    AST 11 (*)    All other components within normal limits  URINALYSIS, ROUTINE W REFLEX MICROSCOPIC - Abnormal; Notable for the following components:   Ketones, ur 15 (*)    All other components within normal limits  LACTIC ACID, PLASMA    EKG None  Radiology DG Chest 2 View  Result Date: 06/16/2020 CLINICAL DATA:  Eleven days postoperative, suspected sepsis, progressively feeling worse, chills, aches, nausea, history hypertension EXAM: CHEST - 2 VIEW COMPARISON:  12/10/2019 FINDINGS: Normal heart size, mediastinal contours, and pulmonary vascularity. Lungs clear. No pulmonary infiltrate, pleural effusion, or pneumothorax. Osseous structures unremarkable. IMPRESSION: No acute abnormalities. Electronically Signed   By: Lavonia Dana M.D.   On:  06/16/2020 16:00   CT Angio Chest PE W/Cm &/Or Wo Cm  Result Date: 06/16/2020 CLINICAL DATA:  Recent bilateral mastectomy with chest pain and chills, initial encounter EXAM: CT ANGIOGRAPHY CHEST WITH CONTRAST TECHNIQUE: Multidetector CT imaging of the chest was performed using the standard protocol during bolus administration of intravenous contrast. Multiplanar CT image reconstructions and MIPs were obtained to evaluate the vascular anatomy. CONTRAST:  153mL OMNIPAQUE IOHEXOL 350 MG/ML SOLN COMPARISON:  Chest x-ray from earlier in the same day. FINDINGS: Cardiovascular: Thoracic aorta and its branches are well visualize without aneurysmal dilatation or dissection. No cardiac enlargement is seen. The pulmonary artery is well visualized within normal branching pattern. No definitive filling defects are identified. No significant coronary calcifications are noted. Mediastinum/Nodes: Thoracic inlet is within normal limits. No hilar or mediastinal adenopathy is seen. The esophagus is within normal limits. Lungs/Pleura: Lungs are well aerated bilaterally. No focal infiltrate or sizable effusion is seen. Minimal dependent atelectatic changes are noted. Upper Abdomen: Visualized upper abdomen is within normal limits. Musculoskeletal: Very mild degenerative changes of the thoracic spine are noted. No rib abnormality is seen. Changes consistent with the bilateral mastectomy are seen. There are soft tissue collections identified bilaterally likely representing postoperative seromas. The collection on the right measures 10.2 x 3.3 cm in greatest dimension laterally. The collection on the left measures 8.7 x 5.7 cm. No findings to suggest abscess formation are noted at this time. Review of the MIP images confirms the above findings. IMPRESSION: Changes consistent with bilateral mastectomy. Postoperative fluid collections are seen bilaterally as described likely representing seromas. No findings to suggest abscess formation  are noted at this time. No evidence of pulmonary embolism. No other acute abnormality is seen. Electronically Signed   By: Inez Catalina M.D.   On: 06/16/2020 21:04    Procedures Procedures (including critical care time)  Medications Ordered in ED Medications  ondansetron (ZOFRAN)  injection 4 mg (has no administration in time range)  morphine 4 MG/ML injection 4 mg (has no administration in time range)  sodium chloride 0.9 % bolus 1,000 mL (1,000 mLs Intravenous New Bag/Given 06/16/20 1734)  ondansetron (ZOFRAN) injection 4 mg (4 mg Intravenous Given 06/16/20 1741)  morphine 4 MG/ML injection 4 mg (4 mg Intravenous Given 06/16/20 1740)  iohexol (OMNIPAQUE) 350 MG/ML injection 100 mL (100 mLs Intravenous Contrast Given 06/16/20 2037)  morphine 4 MG/ML injection 4 mg (4 mg Intravenous Given 06/16/20 1935)    ED Course  I have reviewed the triage vital signs and the nursing notes.  Pertinent labs & imaging results that were available during my care of the patient were reviewed by me and considered in my medical decision making (see chart for details).  Clinical Course as of Jun 16 2218  Sun Jul 25, 135  5027 44 year old female with complaint of chest wall pain after bilateral mastectomy 9 days ago.  No reports of fever, pain not controlled with Percocet at home.  On exam, wounds appear to be healing well, has tenderness to chest wall without overlying erythema.  Patient did have a temperature as high as 99.9 oral while in the emergency room, white blood cell count is within normal limits, lactic acid negative.  Urinalysis with ketones otherwise unremarkable, BMP without significant findings.  Chest x-ray was unremarkable, CT chest added to evaluate for PE this postop patient on hormone therapy.  CT is negative for PE, does show right and left side seromas postop, do not appear consistent with abscess.  Discussed these findings with patient, pain is better controlled with morphine in the ER.  Patient  will continue to manage his pain at home with her Percocet and contact his surgeon in the morning for follow-up.  Patient also understands to monitor his temperature and return for fever or contact his surgeon if he develops a fever.   [LM]    Clinical Course User Index [LM] Roque Lias   MDM Rules/Calculators/A&P                          Final Clinical Impression(s) / ED Diagnoses Final diagnoses:  Chest wall pain following surgery  Seroma of breast    Rx / DC Orders ED Discharge Orders    None       Roque Lias 06/16/20 [place], DO 06/16/20 2226

## 2020-06-16 NOTE — Discharge Instructions (Signed)
Ache your pain medication as prescribed, contact your surgeon's office in the morning for follow-up.  Monitor your temperature for fever.

## 2020-06-16 NOTE — ED Triage Notes (Signed)
Patient states that she is post op 11 days from a double insiscion mastectomy 11 days ago - she states that she has progressively become feeling worse and worse. The patient states that she has chills, aches, Nausea and just a general unwell feeling

## 2020-06-16 NOTE — ED Notes (Addendum)
Pt.  is tearful and c/o anterior chest pain. PA updated and medications have been ordered. Denies any sob.

## 2020-12-04 DIAGNOSIS — F418 Other specified anxiety disorders: Secondary | ICD-10-CM | POA: Diagnosis not present

## 2020-12-04 DIAGNOSIS — I10 Essential (primary) hypertension: Secondary | ICD-10-CM | POA: Diagnosis not present

## 2020-12-04 DIAGNOSIS — E785 Hyperlipidemia, unspecified: Secondary | ICD-10-CM | POA: Diagnosis not present

## 2020-12-25 DIAGNOSIS — F640 Transsexualism: Secondary | ICD-10-CM | POA: Diagnosis not present

## 2021-02-11 DIAGNOSIS — Z01812 Encounter for preprocedural laboratory examination: Secondary | ICD-10-CM | POA: Diagnosis not present

## 2021-02-11 DIAGNOSIS — K644 Residual hemorrhoidal skin tags: Secondary | ICD-10-CM | POA: Diagnosis not present

## 2021-02-11 DIAGNOSIS — Z20822 Contact with and (suspected) exposure to covid-19: Secondary | ICD-10-CM | POA: Diagnosis not present

## 2021-02-12 DIAGNOSIS — Z114 Encounter for screening for human immunodeficiency virus [HIV]: Secondary | ICD-10-CM | POA: Diagnosis not present

## 2021-02-12 DIAGNOSIS — F649 Gender identity disorder, unspecified: Secondary | ICD-10-CM | POA: Diagnosis not present

## 2021-02-12 DIAGNOSIS — Z113 Encounter for screening for infections with a predominantly sexual mode of transmission: Secondary | ICD-10-CM | POA: Diagnosis not present

## 2021-02-12 DIAGNOSIS — Z79899 Other long term (current) drug therapy: Secondary | ICD-10-CM | POA: Diagnosis not present

## 2021-02-14 DIAGNOSIS — F640 Transsexualism: Secondary | ICD-10-CM | POA: Diagnosis not present

## 2021-02-14 DIAGNOSIS — Z79890 Hormone replacement therapy (postmenopausal): Secondary | ICD-10-CM | POA: Diagnosis not present

## 2021-02-14 DIAGNOSIS — Z6838 Body mass index (BMI) 38.0-38.9, adult: Secondary | ICD-10-CM | POA: Diagnosis not present

## 2021-02-14 DIAGNOSIS — Z79899 Other long term (current) drug therapy: Secondary | ICD-10-CM | POA: Diagnosis not present

## 2021-02-14 DIAGNOSIS — I10 Essential (primary) hypertension: Secondary | ICD-10-CM | POA: Diagnosis not present

## 2021-02-14 DIAGNOSIS — E669 Obesity, unspecified: Secondary | ICD-10-CM | POA: Diagnosis not present

## 2021-02-14 DIAGNOSIS — Z91040 Latex allergy status: Secondary | ICD-10-CM | POA: Diagnosis not present

## 2021-02-14 DIAGNOSIS — E785 Hyperlipidemia, unspecified: Secondary | ICD-10-CM | POA: Diagnosis not present

## 2021-04-02 DIAGNOSIS — K640 First degree hemorrhoids: Secondary | ICD-10-CM | POA: Diagnosis not present

## 2021-04-02 DIAGNOSIS — Z79899 Other long term (current) drug therapy: Secondary | ICD-10-CM | POA: Diagnosis not present

## 2021-04-02 DIAGNOSIS — F32A Depression, unspecified: Secondary | ICD-10-CM | POA: Diagnosis not present

## 2021-04-02 DIAGNOSIS — I10 Essential (primary) hypertension: Secondary | ICD-10-CM | POA: Diagnosis not present

## 2021-04-02 DIAGNOSIS — K644 Residual hemorrhoidal skin tags: Secondary | ICD-10-CM | POA: Diagnosis not present

## 2021-04-02 DIAGNOSIS — Z6838 Body mass index (BMI) 38.0-38.9, adult: Secondary | ICD-10-CM | POA: Diagnosis not present

## 2021-04-02 DIAGNOSIS — Z91040 Latex allergy status: Secondary | ICD-10-CM | POA: Diagnosis not present

## 2021-04-02 DIAGNOSIS — Z87891 Personal history of nicotine dependence: Secondary | ICD-10-CM | POA: Diagnosis not present

## 2021-04-02 DIAGNOSIS — E669 Obesity, unspecified: Secondary | ICD-10-CM | POA: Diagnosis not present

## 2021-04-02 DIAGNOSIS — K219 Gastro-esophageal reflux disease without esophagitis: Secondary | ICD-10-CM | POA: Diagnosis not present

## 2021-04-02 DIAGNOSIS — K6289 Other specified diseases of anus and rectum: Secondary | ICD-10-CM | POA: Diagnosis not present

## 2021-04-02 DIAGNOSIS — L309 Dermatitis, unspecified: Secondary | ICD-10-CM | POA: Diagnosis not present

## 2021-04-17 DIAGNOSIS — B379 Candidiasis, unspecified: Secondary | ICD-10-CM | POA: Diagnosis not present

## 2021-05-08 DIAGNOSIS — K644 Residual hemorrhoidal skin tags: Secondary | ICD-10-CM | POA: Diagnosis not present

## 2021-07-02 DIAGNOSIS — Z7689 Persons encountering health services in other specified circumstances: Secondary | ICD-10-CM | POA: Diagnosis not present

## 2021-07-02 DIAGNOSIS — G479 Sleep disorder, unspecified: Secondary | ICD-10-CM | POA: Diagnosis not present

## 2021-07-02 DIAGNOSIS — Z13220 Encounter for screening for lipoid disorders: Secondary | ICD-10-CM | POA: Diagnosis not present

## 2021-07-02 DIAGNOSIS — Z136 Encounter for screening for cardiovascular disorders: Secondary | ICD-10-CM | POA: Diagnosis not present

## 2021-07-02 DIAGNOSIS — Z13228 Encounter for screening for other metabolic disorders: Secondary | ICD-10-CM | POA: Diagnosis not present

## 2021-07-07 DIAGNOSIS — Z136 Encounter for screening for cardiovascular disorders: Secondary | ICD-10-CM | POA: Diagnosis not present

## 2021-07-07 DIAGNOSIS — G479 Sleep disorder, unspecified: Secondary | ICD-10-CM | POA: Diagnosis not present

## 2021-07-07 DIAGNOSIS — Z7689 Persons encountering health services in other specified circumstances: Secondary | ICD-10-CM | POA: Diagnosis not present

## 2021-08-27 DIAGNOSIS — F649 Gender identity disorder, unspecified: Secondary | ICD-10-CM | POA: Diagnosis not present

## 2021-08-27 DIAGNOSIS — L700 Acne vulgaris: Secondary | ICD-10-CM | POA: Diagnosis not present

## 2021-09-01 IMAGING — CR DG CHEST 2V
2 series · 2 of 2 positions shown · non-contrast
Comparison: Remote radiograph 01/30/2008, CT 11/06/2010

CLINICAL DATA: Chest pain.

EXAM:
CHEST - 2 VIEW

[w chest pa]
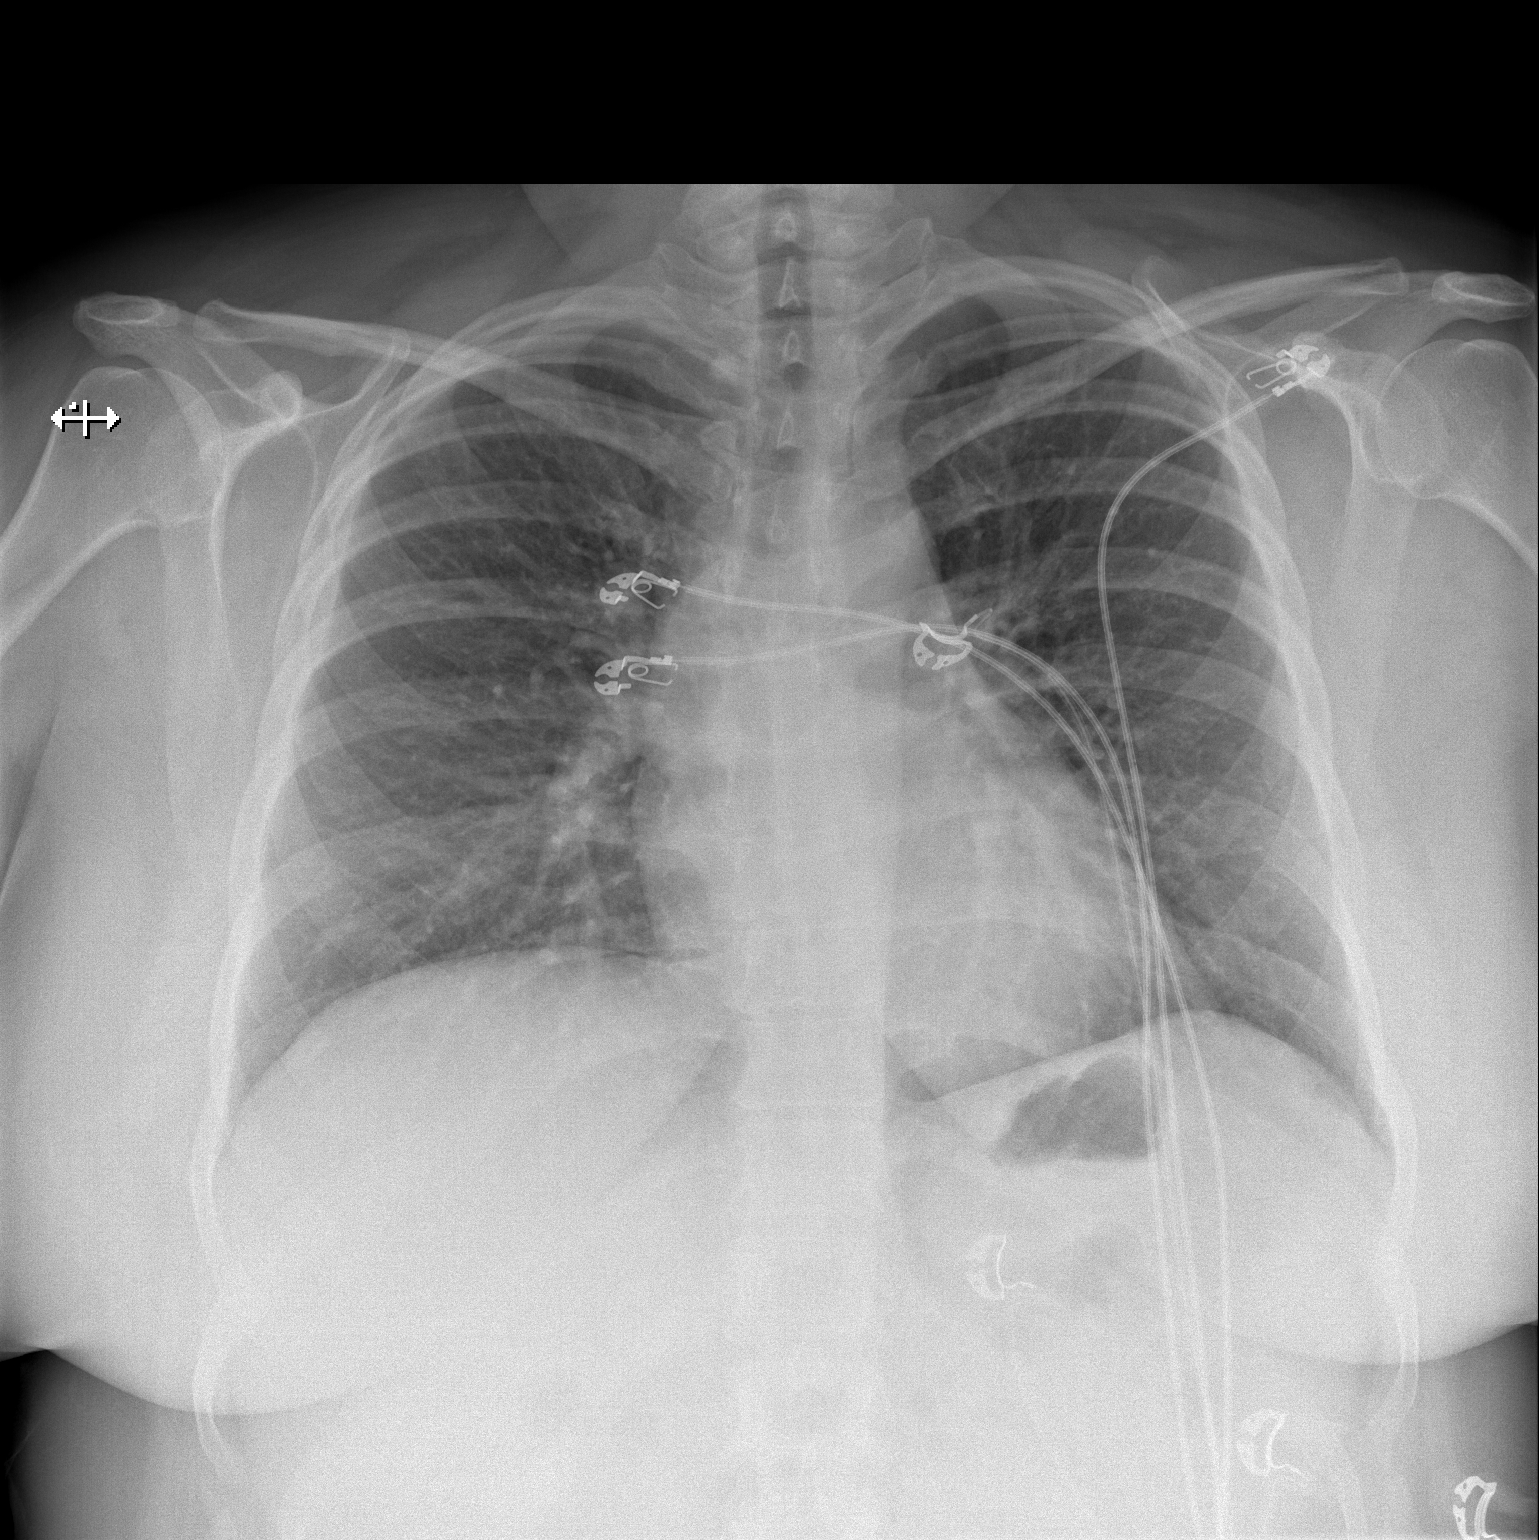

[w chest lat]
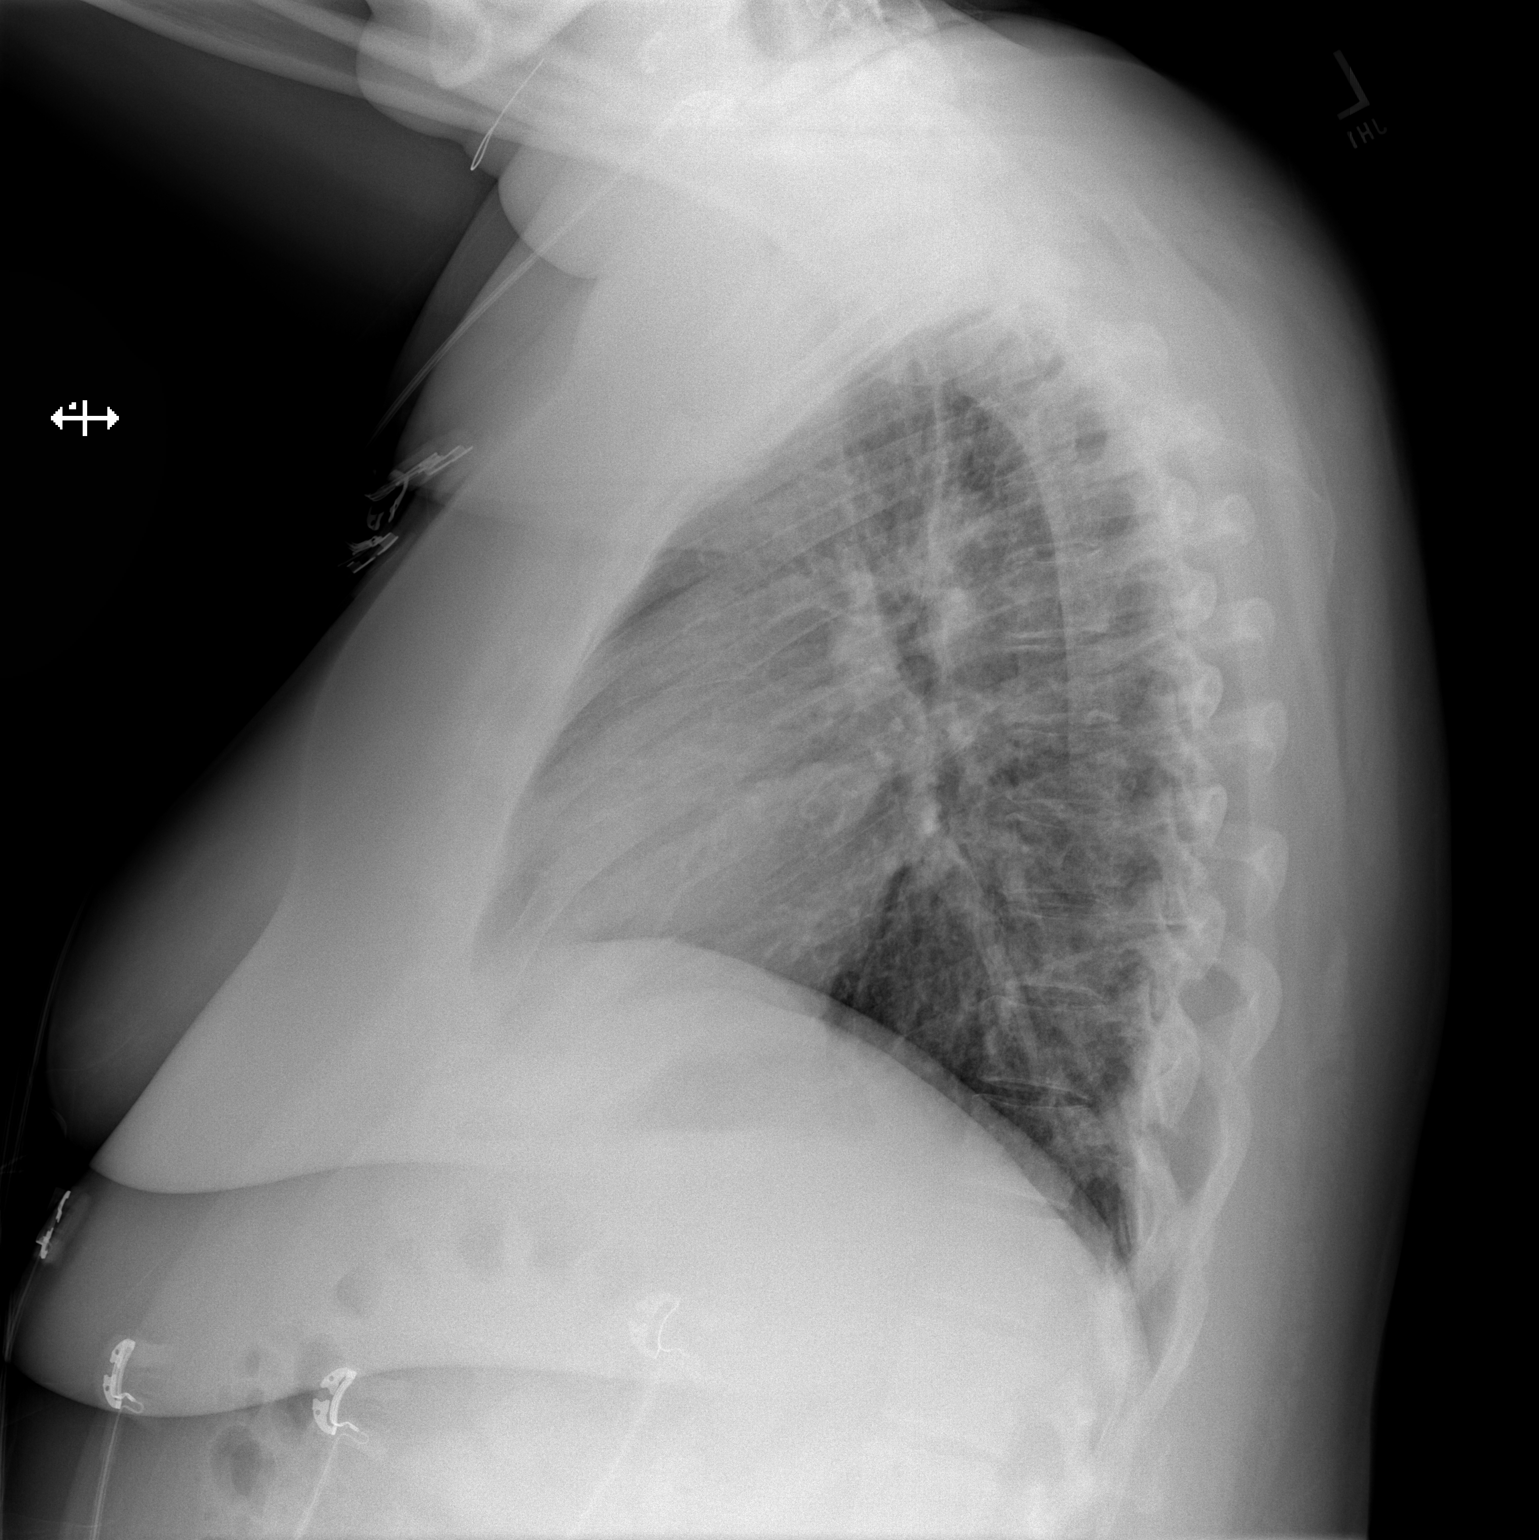

[2 of 2 positions shown; findings below may reference images not displayed]

FINDINGS: The cardiomediastinal contours are normal. The lungs are clear.
Pulmonary vasculature is normal. No consolidation, pleural effusion,
or pneumothorax. No acute osseous abnormalities are seen.
IMPRESSION: No acute chest findings.

## 2021-10-14 DIAGNOSIS — F640 Transsexualism: Secondary | ICD-10-CM | POA: Diagnosis not present

## 2021-10-14 DIAGNOSIS — Z79899 Other long term (current) drug therapy: Secondary | ICD-10-CM | POA: Diagnosis not present

## 2021-12-10 DIAGNOSIS — J029 Acute pharyngitis, unspecified: Secondary | ICD-10-CM | POA: Diagnosis not present

## 2021-12-10 DIAGNOSIS — R059 Cough, unspecified: Secondary | ICD-10-CM | POA: Diagnosis not present

## 2021-12-10 DIAGNOSIS — H1033 Unspecified acute conjunctivitis, bilateral: Secondary | ICD-10-CM | POA: Diagnosis not present

## 2021-12-10 DIAGNOSIS — R519 Headache, unspecified: Secondary | ICD-10-CM | POA: Diagnosis not present

## 2022-03-09 IMAGING — CT CT ANGIO CHEST
3 of 8 series · 14 of 36 positions shown · IV contrast (Omnipaque)
Comparison: Chest x-ray from earlier in the same day.

CLINICAL DATA: Recent bilateral mastectomy with chest pain and
chills, initial encounter

EXAM:
CT ANGIOGRAPHY CHEST WITH CONTRAST
TECHNIQUE: Multidetector CT imaging of the chest was performed using the
standard protocol during bolus administration of intravenous
contrast. Multiplanar CT image reconstructions and MIPs were
obtained to evaluate the vascular anatomy.
CONTRAST:  100mL OMNIPAQUE IOHEXOL 350 MG/ML SOLN

[Series 4: pe axial st · axial · 0.94mm/px · z∈[+118,+301]mm · 5 of 93 slices shown]
[im 16/93  lung]
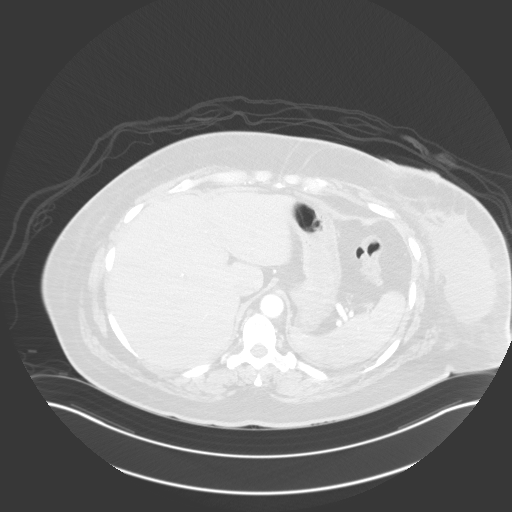
[im 31/93  mediastinal]
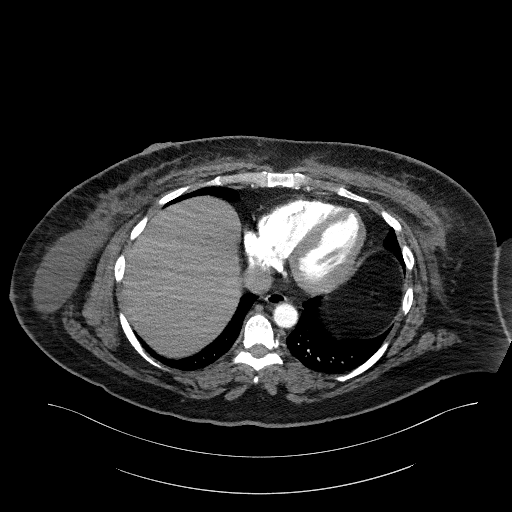
[im 47/93  lung]
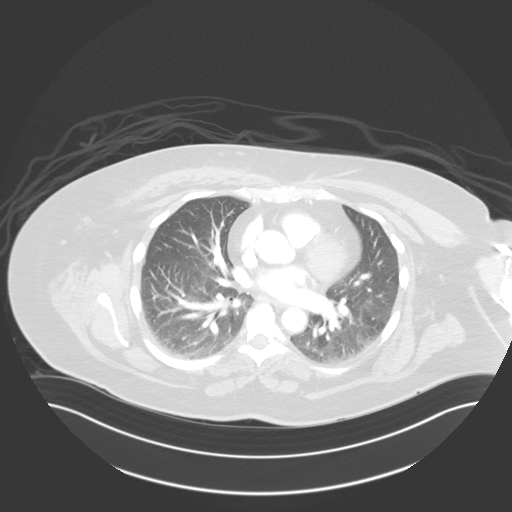
[im 62/93  mediastinal]
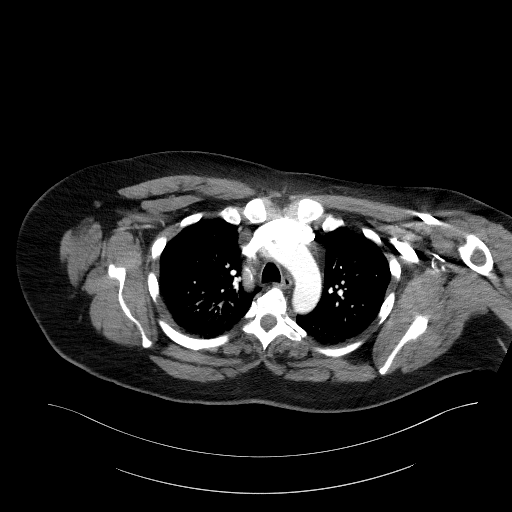
[im 77/93  lung]
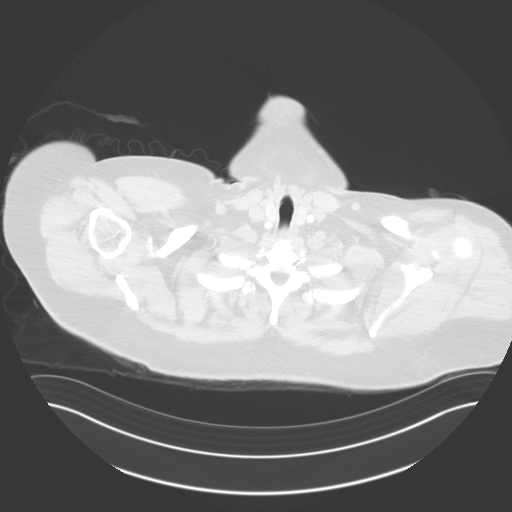

[Series 6: pe coronal mpr · coronal · 0.59mm/px · 1 of 136 slices shown]
[im 68/136  mediastinal]
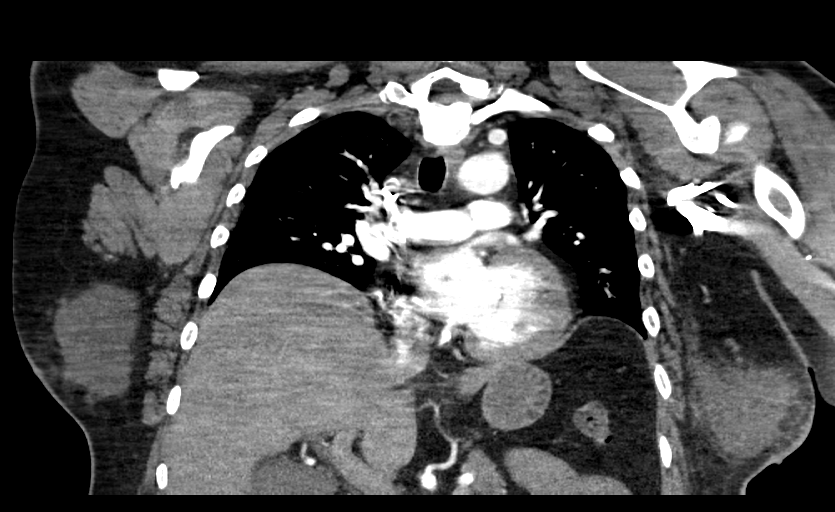

[Series 10: pe thins · axial · 0.96mm/px · z∈[+132,+321]mm · 8 of 245 slices shown]
[im 28/245  lung]
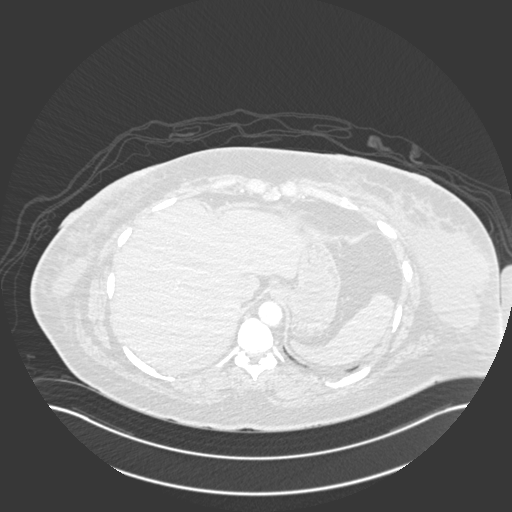
[im 55/245  lung]
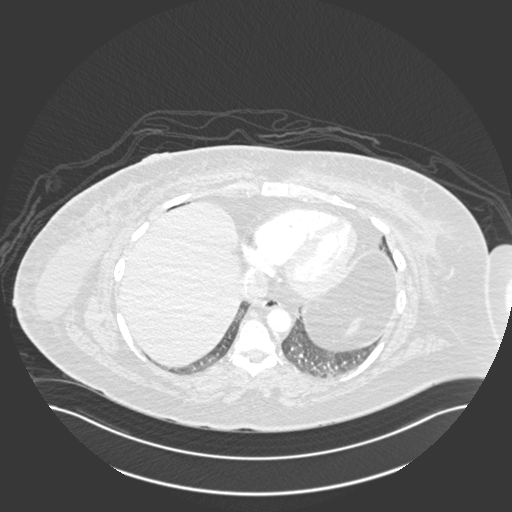
[im 82/245  lung]
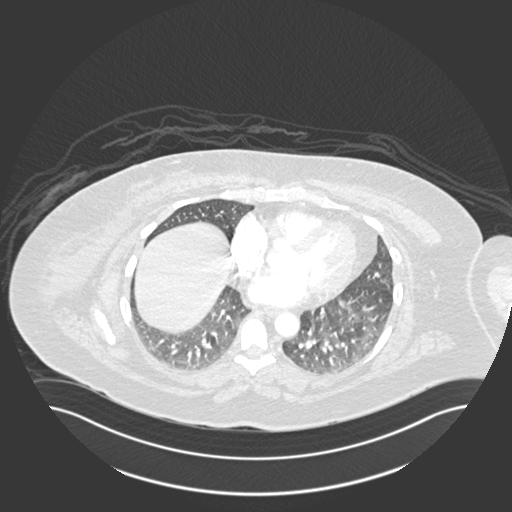
[im 109/245  lung]
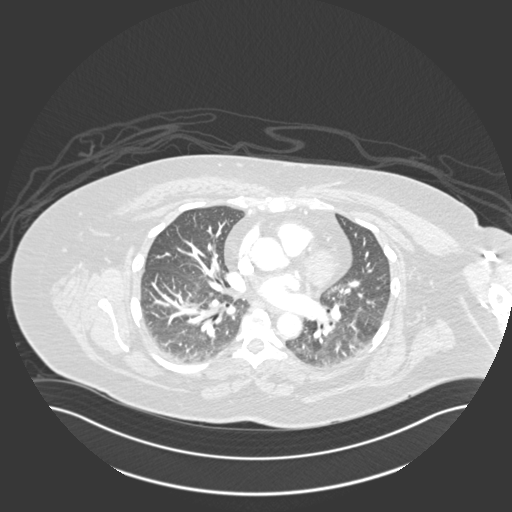
[im 136/245  lung]
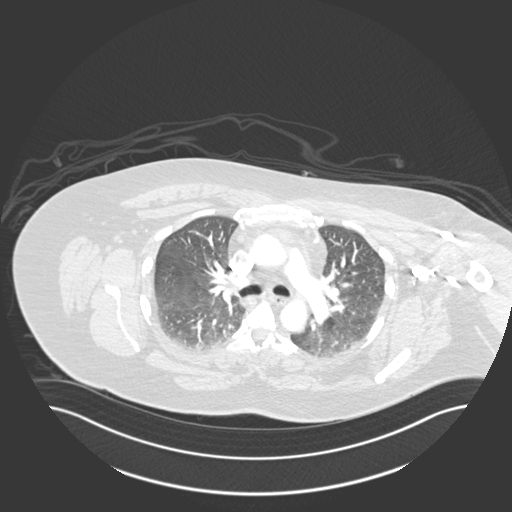
[im 163/245  lung]
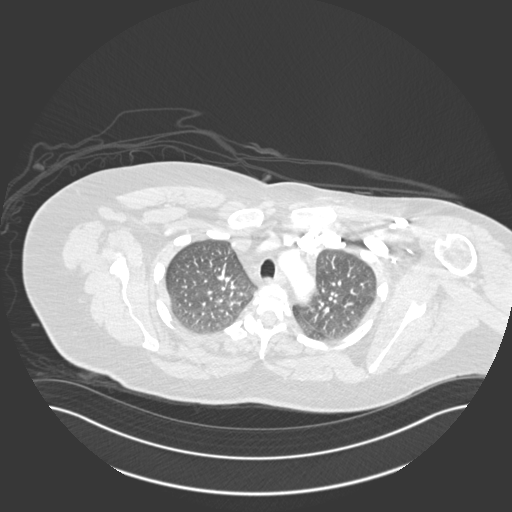
[im 190/245  lung]
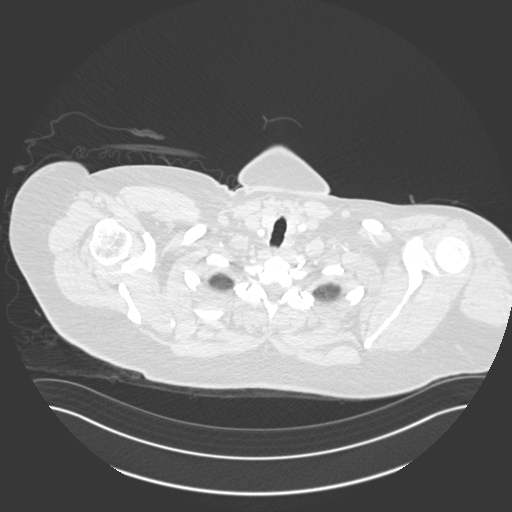
[im 217/245  lung]
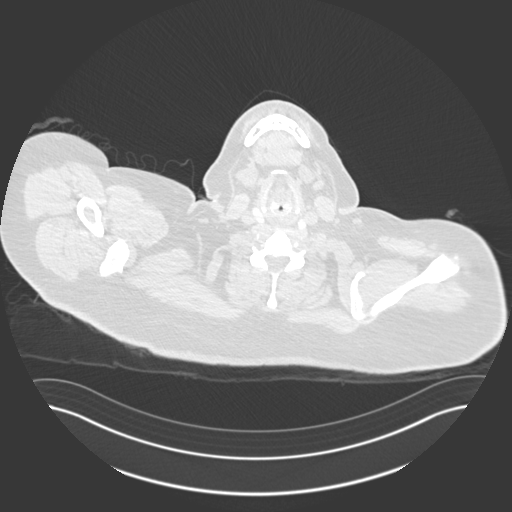

[14 of 36 positions shown; findings below may reference images not displayed]

FINDINGS: Cardiovascular: Thoracic aorta and its branches are well visualize
without aneurysmal dilatation or dissection. No cardiac enlargement
is seen. The pulmonary artery is well visualized within normal
branching pattern. No definitive filling defects are identified. No
significant coronary calcifications are noted.

Mediastinum/Nodes: Thoracic inlet is within normal limits. No hilar
or mediastinal adenopathy is seen. The esophagus is within normal
limits.

Lungs/Pleura: Lungs are well aerated bilaterally. No focal
infiltrate or sizable effusion is seen. Minimal dependent
atelectatic changes are noted.

Upper Abdomen: Visualized upper abdomen is within normal limits.

Musculoskeletal: Very mild degenerative changes of the thoracic
spine are noted. No rib abnormality is seen. Changes consistent with
the bilateral mastectomy are seen. There are soft tissue collections
identified bilaterally likely representing postoperative seromas.
The collection on the right measures 10.2 x 3.3 cm in greatest
dimension laterally. The collection on the left measures 8.7 x
cm. No findings to suggest abscess formation are noted at this time.

Review of the MIP images confirms the above findings.
IMPRESSION: Changes consistent with bilateral mastectomy. Postoperative fluid
collections are seen bilaterally as described likely representing
seromas. No findings to suggest abscess formation are noted at this
time.

No evidence of pulmonary embolism.

No other acute abnormality is seen.
